# Patient Record
Sex: Male | Born: 1965 | Race: White | Hispanic: No | Marital: Married | Smoking: Former smoker
Health system: Southern US, Community
[De-identification: ages and names within clinical notes are randomized; demographics above are authoritative.]

## PROBLEM LIST (undated history)

## (undated) DIAGNOSIS — M1990 Unspecified osteoarthritis, unspecified site: Secondary | ICD-10-CM

## (undated) DIAGNOSIS — E785 Hyperlipidemia, unspecified: Secondary | ICD-10-CM

## (undated) DIAGNOSIS — I2510 Atherosclerotic heart disease of native coronary artery without angina pectoris: Secondary | ICD-10-CM

## (undated) DIAGNOSIS — I10 Essential (primary) hypertension: Secondary | ICD-10-CM

## (undated) DIAGNOSIS — G030 Nonpyogenic meningitis: Secondary | ICD-10-CM

## (undated) DIAGNOSIS — Z720 Tobacco use: Secondary | ICD-10-CM

## (undated) HISTORY — PX: KNEE SURGERY: SHX244

---

## 2003-07-20 ENCOUNTER — Encounter: Payer: Self-pay | Admitting: Emergency Medicine

## 2003-07-21 ENCOUNTER — Inpatient Hospital Stay (HOSPITAL_COMMUNITY): Admission: AD | Admit: 2003-07-21 | Discharge: 2003-07-22 | Payer: Self-pay | Admitting: Internal Medicine

## 2007-08-25 ENCOUNTER — Emergency Department (HOSPITAL_COMMUNITY): Admission: EM | Admit: 2007-08-25 | Discharge: 2007-08-25 | Payer: Self-pay | Admitting: Emergency Medicine

## 2008-10-09 ENCOUNTER — Emergency Department (HOSPITAL_COMMUNITY): Admission: EM | Admit: 2008-10-09 | Discharge: 2008-10-09 | Payer: Self-pay | Admitting: Emergency Medicine

## 2008-12-12 ENCOUNTER — Ambulatory Visit: Payer: Self-pay

## 2008-12-12 ENCOUNTER — Encounter: Payer: Self-pay | Admitting: Family Medicine

## 2008-12-13 ENCOUNTER — Ambulatory Visit (HOSPITAL_BASED_OUTPATIENT_CLINIC_OR_DEPARTMENT_OTHER): Admission: RE | Admit: 2008-12-13 | Discharge: 2008-12-13 | Payer: Self-pay | Admitting: Nurse Practitioner

## 2008-12-18 ENCOUNTER — Ambulatory Visit: Payer: Self-pay | Admitting: Internal Medicine

## 2011-02-05 NOTE — Procedures (Signed)
NAME:  Cameron [name], Cameron [name]                ACCOUNT NO.:  [id number]   MEDICAL RECORD NO.:  [id number]          PATIENT TYPE:  OUT   LOCATION:  SLEEP CENTER                 FACILITY:  [facility]   PHYSICIAN:  Clinton D. Maple Hudson, MD, FCCP, FACPDATE OF BIRTH:  02/29/64   DATE OF STUDY:  12/13/2008                            NOCTURNAL POLYSOMNOGRAM   REFERRING PHYSICIAN:   REFERRING PHYSICIAN:  Bennie Pierini MD   INDICATION FOR STUDY:  Hypersomnia with sleep apnea.   EPWORTH SLEEPINESS SCORE:  2/24, BMI 31.2.  Weight 250 pounds, height 75  inches.  Neck 16.5 inches.   MEDICATIONS:  None listed.   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   SLEEP ARCHITECTURE:  Total sleep time 350 minutes with sleep efficiency  94.1%.  Stage I was 5.6%, stage II 67.3%, stage III absent, REM 27.1% of  total sleep time.  Sleep latency 1.5 minutes, REM latency 83.5 minutes,  awake after sleep onset 18 minutes, arousal index 21.3.  Lights-out for  the study at 22:36 p.m.   RESPIRATORY DATA:  Apnea-hypopnea index (AHI) 13.2 per hour.  A total of  77 events was scored including 16 obstructive apneas, one central apnea,  and 60 hypopneas.  An additional 50 events were counted as respiratory  events related to arousal not meeting duration or other criteria to be  scored as full apneas or hypopneas, but still noted to have some  association with arousal for an average of 8.6 per hour and a total  respiratory disturbance index (RDI) of 21.8 per hour.  Most events were  associated with supine sleep position.  REM AHI 6.9 per hour.  This was  a diagnostic NPSG as ordered and CPAP titration was not done.   OXYGEN DATA:  Very loud snoring with oxygen desaturation to a nadir of  87%.  Mean oxygen saturation through the study 95.5% on room air.   CARDIAC DATA:  Normal sinus rhythm.   MOVEMENT-PARASOMNIA:  No significant movement disturbance.   Bathroom x2.   IMPRESSIONS-RECOMMENDATIONS:  1. This study was done as a standard nocturnal polysomnogram beginning      at 22:36 p.m.  Note that the patient works a rotating shift.  This      may be expected to impact sleep quality and daytime sleepiness.  2. Mild-to-moderate obstructive sleep apnea/hypopnea syndrome, AHI      13.2 per hour, RDI 21.8 per hour.  Most events were associated with      supine sleep position.  Very loud snoring with oxygen desaturation      to a nadir of 87%.  3. The study was done as ordered as a diagnostic NPSG protocol.      Consider return for CPAP titration or evaluate for alternative      management as clinically indicated.  There may be some benefit from      encouraging him to sleep off the flat of his back.      Clinton D. Maple Hudson, MD, FCCP, FACP  Diplomate, Biomedical engineer of Sleep Medicine  Electronically Signed  CDY/MEDQ  D:  12/18/2008 16:08:40  T:  12/19/2008 04:54:09  Job:  [id number]

## 2011-02-08 NOTE — Discharge Summary (Signed)
NAME:  Cameron [name], Cameron [name]                          ACCOUNT NO.:  [id number]   MEDICAL RECORD NO.:  [id number]                   PATIENT TYPE:  INP   LOCATION:  5702                                 FACILITY:  [facility]   PHYSICIAN:  Hollice Espy, M.D.            DATE OF BIRTH:  April 29, 1966   DATE OF ADMISSION:  07/21/2003  DATE OF DISCHARGE:  07/22/2003                                 DISCHARGE SUMMARY   PRIMARY CARE DOCTOR:  Magnus Sinning. Rice, M.D.   DISCHARGE DIAGNOSIS:  Aseptic meningitis.   DISCHARGE MEDICATIONS:  None other than Tylenol 500 mg p.o. q.8h. p.r.n.   DISCHARGE DIET:  Regular.   HISTORY OF PRESENT ILLNESS:  This is a 45 year old white male who works as a  Emergency planning/management officer, who has been in excellent health and four days ago began  just complaining of a sudden onset of severe headache.  The patient  previously has had no significant history for headaches and rarely ever been  ill.  These symptoms persisted and he also complained of some nausea and  vomiting and decided to come in.  A CT of his head was negative in the ER,  and he had a lumbar puncture done.  No bacteria was seen from the specimens  obtained and it was noted he had about 100 white cells on tubes #1 and #4  with noted polys and monos seen on these cell counts.  The patient was  started on Rocephin and initially he was in the [facility] ER, but  transferred over to [facility] for bed availability and isolation  precaution.  The patient was then started on IV acyclovir as well and not  seen in infectious diseases consult, but following an LP in the ER and  starting antibiotics, the patient said symptoms abated and by the time he  was transferred over to [facility], he had no complaints.  He was  tolerating p.o. and denied any nausea or vomiting, denied any headaches,  denied any visual changes.  He not once had had any neck stiffness or any  problems.  He had no neurological deficits.  The patient  was seen by  infectious diseases yesterday evening and it was felt that he had a simple  aseptic meningitis.  This did not require any contact isolation and does not  require any antibiotic treatment.  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  The patient was kept overnight for observation for no further symptoms.  He  denied any other complaints.  This morning, he feels fine.  He denies any  headache, visual changes, dizziness, nausea, or vomiting.  He has tolerated  p.o. without complaint, and is anxious and ready to leave.    DISPOSITION:  The patient will  be discharged to home.  His condition is  improved.  He is to follow up with his PCP, Dr. Dimple Casey, in two weeks for  checkup, and he is advised should his symptoms recur, of headache, nausea,  and vomiting, stiff neck, or visual changes, or photophobia, to come into  the ER right away.                                                Hollice Espy, M.D.    SKK/MEDQ  D:  07/22/2003  T:  07/22/2003  Job:  [id number]   cc:   Magnus Sinning. Dimple Casey, M.D.  [address] [place]  [place] [address]  Fax: [phone]

## 2011-02-26 ENCOUNTER — Ambulatory Visit (INDEPENDENT_AMBULATORY_CARE_PROVIDER_SITE_OTHER): Payer: Worker's Compensation | Admitting: Family Medicine

## 2011-02-26 ENCOUNTER — Encounter: Payer: Self-pay | Admitting: Family Medicine

## 2011-02-26 VITALS — BP 128/84 | Ht 74.0 in | Wt 250.0 lb

## 2011-02-26 DIAGNOSIS — M25569 Pain in unspecified knee: Secondary | ICD-10-CM

## 2011-02-26 DIAGNOSIS — M25562 Pain in left knee: Secondary | ICD-10-CM
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
## 2011-02-27 ENCOUNTER — Other Ambulatory Visit: Payer: Self-pay | Admitting: *Deleted

## 2011-02-27 DIAGNOSIS — M25562 Pain in left knee: Secondary | ICD-10-CM

## 2011-02-27 NOTE — Patient Instructions (Addendum)
[facility] [address] [place], [place] Patients appt is June 15th at 9:30 pt is aware of appt Faxed order  to [phone]      Dr.  Madelon Lips [facility] Tuesday 6.26.12 10:00am [address]  [place] Will fax notes to Roxan Diesel at [facility] @ [phone] Dx: L Meniscal Tear with bone contusion Will fax all records to [facility] at [facility] @ [phone]

## 2011-07-25 HISTORY — PX: JOINT REPLACEMENT: SHX530

## 2012-01-24 ENCOUNTER — Other Ambulatory Visit (INDEPENDENT_AMBULATORY_CARE_PROVIDER_SITE_OTHER): Payer: Self-pay

## 2012-01-24 ENCOUNTER — Encounter (INDEPENDENT_AMBULATORY_CARE_PROVIDER_SITE_OTHER): Payer: Self-pay

## 2012-01-27 ENCOUNTER — Ambulatory Visit (INDEPENDENT_AMBULATORY_CARE_PROVIDER_SITE_OTHER): Payer: 59 | Admitting: Surgery

## 2012-01-30 ENCOUNTER — Encounter (INDEPENDENT_AMBULATORY_CARE_PROVIDER_SITE_OTHER): Payer: Self-pay | Admitting: Surgery

## 2013-01-27 ENCOUNTER — Encounter: Payer: Self-pay | Admitting: Nurse Practitioner

## 2013-01-27 ENCOUNTER — Ambulatory Visit (INDEPENDENT_AMBULATORY_CARE_PROVIDER_SITE_OTHER): Payer: 59 | Admitting: Nurse Practitioner

## 2013-01-27 VITALS — BP 119/66 | HR 87 | Temp 97.3°F | Ht 73.5 in | Wt 260.0 lb

## 2013-01-27 DIAGNOSIS — L0231 Cutaneous abscess of buttock: Secondary | ICD-10-CM

## 2013-01-27 MED ORDER — SULFAMETHOXAZOLE-TRIMETHOPRIM 800-160 MG PO TABS: 1.0000 | ORAL_TABLET | Freq: Two times a day (BID) | ORAL | Status: DC

## 2013-01-27 NOTE — Patient Instructions (Signed)
Abscess An abscess is an infected area that contains a collection of pus and debris. It can occur in almost any part of the body. An abscess is also known as a furuncle or boil. CAUSES   An abscess occurs when tissue gets infected. This can occur from blockage of oil or sweat glands, infection of hair follicles, or a minor injury to the skin. As the body tries to fight the infection, pus collects in the area and creates pressure under the skin. This pressure causes pain. People with weakened immune systems have difficulty fighting infections and get certain abscesses more often.   SYMPTOMS Usually an abscess develops on the skin and becomes a painful mass that is red, warm, and tender. If the abscess forms under the skin, you may feel a moveable soft area under the skin. Some abscesses break open (rupture) on their own, but most will continue to get worse without care. The infection can spread deeper into the body and eventually into the bloodstream, causing you to feel ill.   DIAGNOSIS   Your caregiver will take your medical history and perform a physical exam. A sample of fluid may also be taken from the abscess to determine what is causing your infection. TREATMENT   Your caregiver may prescribe antibiotic medicines to fight the infection. However, taking antibiotics alone usually does not cure an abscess. Your caregiver may need to make a small cut (incision) in the abscess to drain the pus. In some cases, gauze is packed into the abscess to reduce pain and to continue draining the area. HOME CARE INSTRUCTIONS    Only take over-the-counter or prescription medicines for pain, discomfort, or fever as directed by your caregiver.   If you were prescribed antibiotics, take them as directed. Finish them even if you start to feel better.   If gauze is used, follow your caregiver's directions for changing the gauze.   To avoid spreading the infection:   Keep your draining abscess covered with a  bandage.   Wash your hands well.   Do not share personal care items, towels, or whirlpools with others.   Avoid skin contact with others.   Keep your skin and clothes clean around the abscess.   Keep all follow-up appointments as directed by your caregiver.  SEEK MEDICAL CARE IF:    You have increased pain, swelling, redness, fluid drainage, or bleeding.   You have muscle aches, chills, or a general ill feeling.   You have a fever.  MAKE SURE YOU:    Understand these instructions.   Will watch your condition.   Will get help right away if you are not doing well or get worse.  Document Released: 06/19/2005 Document Revised: 03/10/2012 Document Reviewed: 11/22/2011 Exit[employer] Patient Information 2013 [employer].    

## 2013-01-27 NOTE — Progress Notes (Signed)
  Subjective:    Patient ID: AKHIL PISCOPO, male    DOB: 01/28/1966, 47 y.o.   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 Lesion has returned. Stared draining several days ago. Tender to touch.    Review of Systems  All other systems reviewed and are negative.       Objective:   Physical Exam  Constitutional: He appears well-developed and well-nourished.  Cardiovascular: Normal rate and normal heart sounds.   Pulmonary/Chest: Effort normal and breath sounds normal.  Skin:  3cm indurated erythematous lesion left upper buttocks   BP 119/66  Pulse 87  Temp(Src) 97.3 F (36.3 C) (Oral)  Ht 6' 1.5" (1.867 m)  Wt 260 lb (117.935 kg)  BMI 33.83 kg/m2        Assessment & Plan:  1. Cellulitis and abscess of buttock Moist compresses Do not pick at area - sulfamethoxazole-trimethoprim (BACTRIM DS,SEPTRA DS) 800-160 MG per tablet; Take 1 tablet by mouth 2 (two) times daily.  Dispense: 20 tablet; Refill: 0 Mary-Margaret Daphine Deutscher, FNP

## 2013-02-01 ENCOUNTER — Ambulatory Visit (INDEPENDENT_AMBULATORY_CARE_PROVIDER_SITE_OTHER): Payer: 59 | Admitting: Physician Assistant

## 2013-02-01 ENCOUNTER — Encounter: Payer: Self-pay | Admitting: Physician Assistant

## 2013-02-01 VITALS — BP 117/70 | HR 86 | Temp 97.0°F | Ht 73.0 in | Wt 263.4 lb

## 2013-02-01 DIAGNOSIS — M549 Dorsalgia, unspecified: Secondary | ICD-10-CM

## 2013-02-01 LAB — POCT UA - MICROSCOPIC ONLY
Bacteria, U Microscopic: NEGATIVE
WBC, Ur, HPF, POC: NEGATIVE
Yeast, UA: NEGATIVE

## 2013-02-01 LAB — POCT URINALYSIS DIPSTICK
Bilirubin, UA: NEGATIVE
Ketones, UA: NEGATIVE
Leukocytes, UA: NEGATIVE

## 2013-02-01 MED ORDER — MELOXICAM 15 MG PO TABS: 15.0000 mg | ORAL_TABLET | Freq: Every day | ORAL | Status: DC

## 2013-02-01 MED ORDER — CYCLOBENZAPRINE HCL 10 MG PO TABS: 10.0000 mg | ORAL_TABLET | Freq: Three times a day (TID) | ORAL | Status: DC | PRN

## 2013-02-01 NOTE — Progress Notes (Signed)
Subjective:     Patient ID: Cameron [name], male   DOB: 19-Oct-1965, 47 y.o.   MRN: [id number]  Back Pain This is a new problem. The current episode started in the past 7 days. The problem occurs constantly. The problem is unchanged. The pain is present in the lumbar spine. The quality of the pain is described as aching. The pain does not radiate. The pain is at a severity of 4/10. The pain is mild. The pain is worse during the night. The symptoms are aggravated by bending. Stiffness is present all day. Pertinent negatives include no bladder incontinence, dysuria, fever, leg pain, numbness, paresis, paresthesias, perianal numbness, tingling or weakness. He has tried ice and NSAIDs for the symptoms. The treatment provided mild relief.     Review of Systems  Constitutional: Negative for fever.  Genitourinary: Negative for bladder incontinence and dysuria.  Musculoskeletal: Positive for back pain.  Neurological: Negative for tingling, weakness, numbness and paresthesias.       Objective:   Physical Exam  Musculoskeletal:       Thoracic back: He exhibits normal range of motion, no tenderness, no bony tenderness, no swelling, no edema, no deformity and no spasm.       Lumbar back: He exhibits normal range of motion, no tenderness, no bony tenderness, no swelling, no edema, no deformity and no spasm.  Neurological:  SLR neg DTR 2+/= lower ext       Assessment:     Back pain    Plan:     Mobic and Flexeril rx - SE reviewed Heat/Ice Gentle strectching

## 2013-02-09 ENCOUNTER — Encounter: Payer: Self-pay | Admitting: Nurse Practitioner

## 2013-02-09 ENCOUNTER — Ambulatory Visit (INDEPENDENT_AMBULATORY_CARE_PROVIDER_SITE_OTHER): Payer: 59 | Admitting: Nurse Practitioner

## 2013-02-09 VITALS — BP 124/74 | HR 79 | Temp 97.0°F | Ht 73.25 in | Wt 260.0 lb

## 2013-02-09 DIAGNOSIS — B029 Zoster without complications: Secondary | ICD-10-CM

## 2013-02-09 MED ORDER — TRAMADOL HCL 50 MG PO TABS: 50.0000 mg | ORAL_TABLET | Freq: Three times a day (TID) | ORAL | Status: DC | PRN

## 2013-02-09 MED ORDER — VALACYCLOVIR HCL 1 G PO TABS: 1000.0000 mg | ORAL_TABLET | Freq: Three times a day (TID) | ORAL | Status: DC

## 2013-02-09 NOTE — Progress Notes (Signed)
  Subjective:    Patient ID: Cameron [name], male    DOB: 1965-12-29, 47 y.o.   MRN: [id number]  HPI Patient was seen by B. Webster last week with back pain- Was given flexerile and anti inflammatory- No better- Actually has a rash in area and is experiencing a sensation of numbness. Rash itches slightly.    Review of Systems  Constitutional: Negative for fever, chills and appetite change.  HENT: Negative.   Respiratory: Negative.   Cardiovascular: Negative.   Gastrointestinal: Negative for nausea, vomiting and diarrhea.  Musculoskeletal: Positive for back pain (slight).  Skin: Positive for rash (right flank).  Psychiatric/Behavioral: Negative.        Objective:   Physical Exam  Constitutional: He appears well-developed and well-nourished.  Cardiovascular: Normal rate, normal heart sounds and intact distal pulses.   Pulmonary/Chest: Effort normal and breath sounds normal.  Abdominal: Soft. Bowel sounds are normal.  Skin: Skin is warm. Rash (4cm patch of vesicular lesions right fllank.) noted.          Assessment & Plan:

## 2013-02-09 NOTE — Patient Instructions (Signed)

## 2013-02-18 ENCOUNTER — Encounter: Payer: Self-pay | Admitting: Nurse Practitioner

## 2013-02-18 ENCOUNTER — Ambulatory Visit (INDEPENDENT_AMBULATORY_CARE_PROVIDER_SITE_OTHER): Payer: 59 | Admitting: Nurse Practitioner

## 2013-02-18 VITALS — BP 106/67 | HR 72 | Temp 97.3°F | Ht 73.25 in | Wt 258.5 lb

## 2013-02-18 DIAGNOSIS — Q828 Other specified congenital malformations of skin: Secondary | ICD-10-CM

## 2013-02-18 NOTE — Patient Instructions (Signed)
Wound Care Wound care helps prevent pain and infection.  You may need a tetanus shot if:  You cannot remember when you had your last tetanus shot.  You have never had a tetanus shot.  The injury broke your skin. If you need a tetanus shot and you choose not to have one, you may get tetanus. Sickness from tetanus can be serious. HOME CARE   Only take medicine as told by your doctor.  Clean the wound daily with mild soap and water.  Change any bandages (dressings) as told by your doctor.  Put medicated cream and a bandage on the wound as told by your doctor.  Change the bandage if it gets wet, dirty, or starts to smell.  Take showers. Do not take baths, swim, or do anything that puts your wound under water.  Rest and raise (elevate) the wound until the pain and puffiness (swelling) are better.  Keep all doctor visits as told. GET HELP RIGHT AWAY IF:   Yellowish-white fluid (pus) comes from the wound.  Medicine does not lessen your pain.  There is a red streak going away from the wound.  You have a fever. MAKE SURE YOU:   Understand these instructions.  Will watch your condition.  Will get help right away if you are not doing well or get worse. Document Released: 06/18/2008 Document Revised: 12/02/2011 Document Reviewed: 01/13/2011 ExitCare Patient Information 2014 [employer].  

## 2013-02-18 NOTE — Progress Notes (Signed)
  Subjective:    Patient ID: Cameron [name], male    DOB: Oct 31, 1965, 47 y.o.   MRN: [id number]  HPI Patient in today for removal of 2 skin tags right axilla- They have been there for several years and have grown in size.    Review of Systems  Unable to perform ROS All other systems reviewed and are negative.       Objective:   Physical Exam  Constitutional: He is oriented to person, place, and time. He appears well-developed and well-nourished.  Cardiovascular: Normal rate, normal heart sounds and intact distal pulses.   Pulmonary/Chest: Effort normal and breath sounds normal.  Neurological: He is alert and oriented to person, place, and time.  Skin: Skin is warm and dry.  2 flesh colored skin tags right axilla- 1-0.5cm and 2-0.5cm  Psychiatric: He has a normal mood and affect. His behavior is normal. Judgment and thought content normal.    Procedure: Cleaned with beatdine Lidocaine 1% with epi- 1ml local Cleaned with betadine again Tags removed with stevens scissors and pick ups Monsels solution for cauterization bandaid applied      Assessment & Plan:  Removal of skin tag right axilla  Wound care directions given to patient Watch for signs of infection RTO PRN  Mary-Margaret Daphine Deutscher, FNP

## 2013-07-29 ENCOUNTER — Other Ambulatory Visit: Payer: Self-pay

## 2013-09-10 ENCOUNTER — Ambulatory Visit (INDEPENDENT_AMBULATORY_CARE_PROVIDER_SITE_OTHER): Payer: 59 | Admitting: Physician Assistant

## 2013-09-10 ENCOUNTER — Encounter: Payer: Self-pay | Admitting: Physician Assistant

## 2013-09-10 DIAGNOSIS — H6690 Otitis media, unspecified, unspecified ear: Secondary | ICD-10-CM

## 2013-09-10 MED ORDER — AZITHROMYCIN 250 MG PO TABS: ORAL_TABLET | ORAL | Status: DC

## 2013-09-10 NOTE — Patient Instructions (Signed)
May need add Sudafed if pressure and dizziness continues.

## 2013-09-10 NOTE — Progress Notes (Signed)
   Subjective:    Patient ID: Cameron [name], male    DOB: Dec 25, 1965, 47 y.o.   MRN: [id number]  HPI 47 y/o male presents with R ear pain x 3 days. Worse with lying on his right side. Better with swallowing. Has not tried any medications.   Review of Systems  Constitutional: Negative.   HENT: Positive for ear pain (Right ear. Worse with lying on right side. ) and tinnitus (right ear). Negative for congestion, dental problem, facial swelling, hearing loss, postnasal drip and sinus pressure.   Eyes: Negative.   Respiratory: Negative.   Cardiovascular: Negative.   Gastrointestinal: Negative.   Genitourinary: Negative.   Neurological: Positive for dizziness (intermittent when going from supine position to sitting).       Objective:   Physical Exam  Nursing note and vitals reviewed. Constitutional: He appears well-developed and well-nourished. No distress.  HENT:  Head: Normocephalic and atraumatic.  Right Ear: External ear normal. Tympanic membrane is erythematous.  Left Ear: Tympanic membrane and external ear normal.  Mouth/Throat: Oropharynx is clear and moist. No oropharyngeal exudate.  Eyes: Pupils are equal, round, and reactive to light. Right eye exhibits no discharge. Left eye exhibits no discharge.  Neck: Normal range of motion.  Cardiovascular: Normal rate, regular rhythm, normal heart sounds and intact distal pulses.  Exam reveals no gallop and no friction rub.   No murmur heard. Pulmonary/Chest: Effort normal and breath sounds normal. No respiratory distress. He has no wheezes. He has no rales. He exhibits no tenderness.  Skin: He is not diaphoretic.          Assessment & Plan:  1. AOM Right ear: Rx azithromycin 500mg  day 1, 250 mg x 4 days. Add Sudafed if pressure continues for decongestant. Tylenol/ Ibuprofen for pain. RTC if s/s worsen or do not improve.

## 2013-11-01 ENCOUNTER — Ambulatory Visit (INDEPENDENT_AMBULATORY_CARE_PROVIDER_SITE_OTHER): Payer: 59 | Admitting: Nurse Practitioner

## 2013-11-01 ENCOUNTER — Encounter: Payer: Self-pay | Admitting: Nurse Practitioner

## 2013-11-01 VITALS — BP 135/76 | HR 79 | Temp 96.6°F | Ht 74.0 in | Wt 265.0 lb

## 2013-11-01 DIAGNOSIS — Z0000 Encounter for general adult medical examination without abnormal findings: Secondary | ICD-10-CM

## 2013-11-01 LAB — POCT CBC
GRANULOCYTE PERCENT: 72.3 % (ref 37–80)
HCT, POC: 49.8 % (ref 43.5–53.7)
Hemoglobin: 16.3 g/dL (ref 14.1–18.1)
LYMPH, POC: 2.4 (ref 0.6–3.4)
MCH, POC: 30.6 pg (ref 27–31.2)
MCHC: 32.7 g/dL (ref 31.8–35.4)
MCV: 93.6 fL (ref 80–97)
MPV: 8.2 fL (ref 0–99.8)
PLATELET COUNT, POC: 249 10*3/uL (ref 142–424)
POC GRANULOCYTE: 7.9 — AB (ref 2–6.9)
POC LYMPH %: 22.3 % (ref 10–50)
RBC: 5.3 M/uL (ref 4.69–6.13)
RDW, POC: 12.3 %
WBC: 10.9 10*3/uL — AB (ref 4.6–10.2)

## 2013-11-01 NOTE — Patient Instructions (Signed)

## 2013-11-01 NOTE — Progress Notes (Signed)
   Subjective:    Patient ID: MANISH RUGGIERO, male    DOB: April 12, 1966, 48 y.o.   MRN: [id number]  HPI Patient in today for CPE- He is doing well without complaints. He has no complaints today.    Review of Systems  Constitutional: Negative for fever and chills.  HENT: Positive for congestion and rhinorrhea. Negative for sinus pressure, sore throat and trouble swallowing.   Respiratory: Positive for cough.   Cardiovascular: Negative.   Gastrointestinal: Negative.   Genitourinary: Negative.   Musculoskeletal: Negative.   Hematological: Negative.   Psychiatric/Behavioral: Negative.   All other systems reviewed and are negative.       Objective:   Physical Exam  Constitutional: He is oriented to person, place, and time. He appears well-developed and well-nourished.  HENT:  Head: Normocephalic.  Right Ear: External ear normal.  Left Ear: External ear normal.  Nose: Nose normal.  Mouth/Throat: Oropharynx is clear and moist.  Eyes: EOM are normal. Pupils are equal, round, and reactive to light.  Neck: Normal range of motion. Neck supple. No JVD present. No thyromegaly present.  Cardiovascular: Normal rate, regular rhythm, normal heart sounds and intact distal pulses.  Exam reveals no gallop and no friction rub.   No murmur heard. Pulmonary/Chest: Effort normal and breath sounds normal. No respiratory distress. He has no wheezes. He has no rales. He exhibits no tenderness.  Abdominal: Soft. Bowel sounds are normal. He exhibits no mass. There is no tenderness.  Genitourinary: Prostate normal and penis normal.  Musculoskeletal: Normal range of motion. He exhibits no edema.  Lymphadenopathy:    He has no cervical adenopathy.  Neurological: He is alert and oriented to person, place, and time. No cranial nerve deficit.  Skin: Skin is warm and dry.  Psychiatric: He has a normal mood and affect. His behavior is normal. Judgment and thought content normal.   BP 135/76  Pulse 79   Temp(Src) 96.6 F (35.9 C) (Oral)  Ht $R'6\' 2"'XL$  (1.88 m)  Wt 265 lb (120.203 kg)  BMI 34.01 kg/m2        Assessment & Plan:   1. Annual physical exam    Orders Placed This Encounter  Procedures  . CMP14+EGFR  . NMR, lipoprofile  . POCT CBC   Labs pending Health maintenance reviewed Diet and exercise encouraged Continue all meds Follow up  In 6 months    [place], FNP

## 2013-11-03 LAB — NMR, LIPOPROFILE
Cholesterol: 170 mg/dL (ref <200)
HDL CHOLESTEROL BY NMR: 41 mg/dL (ref >=40)
HDL Particle Number: 28.8 umol/L — ABNORMAL LOW (ref >=30.5)
LDL Particle Number: 1701 nmol/L — ABNORMAL HIGH (ref <1000)
LDL Size: 20 nm — ABNORMAL LOW (ref >20.5)
LDLC SERPL CALC-MCNC: 105 mg/dL — AB (ref <100)
LP-IR Score: 74 — ABNORMAL HIGH (ref <=45)
Small LDL Particle Number: 1208 nmol/L — ABNORMAL HIGH (ref <=527)
Triglycerides by NMR: 121 mg/dL (ref <150)

## 2013-11-03 LAB — CMP14+EGFR
ALT: 38 IU/L (ref 0–44)
AST: 31 IU/L (ref 0–40)
Albumin/Globulin Ratio: 2.1 (ref 1.1–2.5)
Albumin: 4.5 g/dL (ref 3.5–5.5)
Alkaline Phosphatase: 110 IU/L (ref 39–117)
BUN/Creatinine Ratio: 11 (ref 9–20)
BUN: 12 mg/dL (ref 6–24)
CALCIUM: 9.5 mg/dL (ref 8.7–10.2)
CO2: 25 mmol/L (ref 18–29)
CREATININE: 1.14 mg/dL (ref 0.76–1.27)
Chloride: 100 mmol/L (ref 97–108)
GFR calc Af Amer: 87 mL/min/{1.73_m2} (ref >59)
GFR calc non Af Amer: 75 mL/min/{1.73_m2} (ref >59)
GLOBULIN, TOTAL: 2.1 g/dL (ref 1.5–4.5)
GLUCOSE: 94 mg/dL (ref 65–99)
Potassium: 4.6 mmol/L (ref 3.5–5.2)
Sodium: 140 mmol/L (ref 134–144)
Total Bilirubin: 0.4 mg/dL (ref 0.0–1.2)
Total Protein: 6.6 g/dL (ref 6.0–8.5)

## 2013-11-25 ENCOUNTER — Encounter: Payer: Self-pay | Admitting: Nurse Practitioner

## 2014-12-18 ENCOUNTER — Emergency Department (INDEPENDENT_AMBULATORY_CARE_PROVIDER_SITE_OTHER)
Admission: EM | Admit: 2014-12-18 | Discharge: 2014-12-18 | Disposition: A | Discharge status: Home / Self Care | Payer: Worker's Compensation | Source: Home / Self Care | Attending: Family Medicine | Admitting: Family Medicine

## 2014-12-18 ENCOUNTER — Emergency Department (INDEPENDENT_AMBULATORY_CARE_PROVIDER_SITE_OTHER): Payer: Worker's Compensation

## 2014-12-18 ENCOUNTER — Encounter (HOSPITAL_COMMUNITY): Payer: Self-pay | Admitting: Emergency Medicine

## 2014-12-18 DIAGNOSIS — R609 Edema, unspecified: Secondary | ICD-10-CM | POA: Diagnosis not present

## 2014-12-18 DIAGNOSIS — R52 Pain, unspecified: Secondary | ICD-10-CM | POA: Diagnosis not present

## 2014-12-18 DIAGNOSIS — T1490 Injury, unspecified: Secondary | ICD-10-CM

## 2014-12-18 DIAGNOSIS — M79644 Pain in right finger(s): Secondary | ICD-10-CM

## 2014-12-18 DIAGNOSIS — T1490XA Injury, unspecified, initial encounter: Secondary | ICD-10-CM

## 2014-12-18 MED ORDER — MELOXICAM 15 MG PO TABS: 15.0000 mg | ORAL_TABLET | Freq: Every day | ORAL | Status: DC

## 2014-12-18 NOTE — ED Provider Notes (Signed)
CSN: [id number]     Arrival date & time 12/18/14  1037 History   None    Chief Complaint  Patient presents with  . 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 He was tackling a person and they landed on his hand. He now has pain in the proximal portion of the first metacarpal, as well as swelling. He thought he just jammed it but the pain and swelling have increased. A few hours ago he had difficulty releasing the thumb latch on his gun holster and he was told to come in and be checked out. Denies any other injury, no numbness in the finger.  History reviewed. No pertinent past medical history. Past Surgical History  Procedure Laterality Date  . Joint replacement Left 11/12    knee   Family History  Problem Relation Age of Onset  . Thyroid disease Mother   . Cancer Mother     moles- removed  . Stroke Father    History  Substance Use Topics  . Smoking status: Former Smoker    Quit date: 01/27/2001  . Smokeless tobacco: Not on file  . Alcohol Use: No    Review of Systems  Musculoskeletal:       See history of present illness  All other systems reviewed and are negative.   Allergies  Review of patient's allergies indicates no known allergies.  Home Medications   Prior to Admission medications   Medication Sig Start Date End Date Taking? Authorizing Provider  cyclobenzaprine (FLEXERIL) 10 MG tablet Take 1 tablet (10 mg total) by mouth 3 (three) times daily as needed for muscle spasms. 02/01/13   Lodema Pilot, PA-C  meloxicam (MOBIC) 15 MG tablet Take 1 tablet (15 mg total) by mouth daily. 12/18/14   Freeman Caldron Deondrick Searls, PA-C   BP 135/79 mmHg  Pulse 86  Temp(Src) 97.4 F (36.3 C) (Oral)  Resp 14  SpO2 96% Physical Exam  Constitutional: He is oriented to person, place, and time. He  appears well-developed and well-nourished. No distress.  HENT:  Head: Normocephalic.  Pulmonary/Chest: Effort normal. No respiratory distress.  Musculoskeletal:       Right hand: He exhibits tenderness (erythema, tenderness, swelling around the lateral proximal first metacarpal, just distal to the anatomic snuffbox) and swelling.  Neurological: He is alert and oriented to person, place, and time. Coordination normal.  Skin: Skin is warm and dry. No rash noted. He is not diaphoretic.  Psychiatric: He has a normal mood and affect. Judgment normal.  Nursing note and vitals reviewed.   ED Course  Procedures (including critical care time) Labs Review Labs Reviewed - No data to display  Imaging Review Dg Wrist Complete Right  12/18/2014   CLINICAL DATA:  Right wrist pain and right thumb pain post injury yesterday  EXAM: RIGHT WRIST - COMPLETE 3+ VIEW  COMPARISON:  None.  FINDINGS: Four views of the right wrist submitted. No acute fracture or subluxation. Mild degenerative changes first carpometacarpal joint. Mild narrowing of radiocarpal joint space.  IMPRESSION: No acute fracture or subluxation.  Mild degenerative changes.   Electronically Signed   By: Lahoma Crocker M.D.   On: 12/18/2014 11:34   Dg Hand Complete Right  12/18/2014   CLINICAL DATA:  Right thumb pain post injury yesterday  EXAM: RIGHT HAND - COMPLETE 3+ VIEW  COMPARISON:  None.  FINDINGS: Three views of the right hand submitted. No acute fracture or subluxation. Mild degenerative changes first carpometacarpal joint.  IMPRESSION: No acute fracture or subluxation. Mild degenerative changes first carpometacarpal joint.   Electronically Signed   By: Lahoma Crocker M.D.   On: 12/18/2014 11:33     MDM   1. Thumb pain, right   2. Injury   3. Pain   4. Swelling    No x-ray evidence of any fracture. Treat with a thumb spica splint, daily meloxicam, ice, elevation. Wear the splint for 5 days. He has to be cleared by their medical provider at  his work before returning, however he is advised to come back here or have them order an x-ray if he is still having significant pain in one week to check for occult fracture.  Meds ordered this encounter  Medications  . meloxicam (MOBIC) 15 MG tablet    Sig: Take 1 tablet (15 mg total) by mouth daily.    Dispense:  14 tablet    Refill:  0       Liam Graham, PA-C 12/18/14 1146

## 2014-12-18 NOTE — ED Notes (Signed)
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  States the person land on is right hand injury thumb sided.  Mild swelling noted and pain radiating down thumb side into wrist.

## 2014-12-18 NOTE — Discharge Instructions (Signed)

## 2015-02-09 ENCOUNTER — Telehealth: Payer: Self-pay | Admitting: Nurse Practitioner

## 2015-02-09 NOTE — Telephone Encounter (Signed)
Stp and given appt for 6/3 at 12:30 with MMM.

## 2015-02-10 ENCOUNTER — Ambulatory Visit: Payer: Self-pay | Admitting: Nurse Practitioner

## 2015-02-24 ENCOUNTER — Encounter: Payer: Self-pay | Admitting: Nurse Practitioner

## 2015-02-24 ENCOUNTER — Ambulatory Visit (INDEPENDENT_AMBULATORY_CARE_PROVIDER_SITE_OTHER): Payer: 59 | Admitting: Nurse Practitioner

## 2015-02-24 VITALS — BP 122/76 | HR 87 | Temp 97.3°F | Ht 74.0 in | Wt 267.0 lb

## 2015-02-24 DIAGNOSIS — Z0000 Encounter for general adult medical examination without abnormal findings: Secondary | ICD-10-CM | POA: Diagnosis not present

## 2015-02-24 LAB — POCT CBC
GRANULOCYTE PERCENT: 65.7 % (ref 37–80)
HEMATOCRIT: 48.4 % (ref 43.5–53.7)
Hemoglobin: 15.7 g/dL (ref 14.1–18.1)
Lymph, poc: 2.7 (ref 0.6–3.4)
MCH, POC: 29.9 pg (ref 27–31.2)
MCHC: 32.4 g/dL (ref 31.8–35.4)
MCV: 92.1 fL (ref 80–97)
MPV: 9 fL (ref 0–99.8)
POC Granulocyte: 5.9 (ref 2–6.9)
POC LYMPH PERCENT: 29.8 %L (ref 10–50)
Platelet Count, POC: 234 10*3/uL (ref 142–424)
RBC: 5.26 M/uL (ref 4.69–6.13)
RDW, POC: 12.3 %
WBC: 9 10*3/uL (ref 4.6–10.2)

## 2015-02-24 NOTE — Addendum Note (Signed)
Addended by: Earlene Plater on: 02/24/2015 01:10 PM   Modules accepted: Miquel Dunn

## 2015-02-24 NOTE — Progress Notes (Signed)
   Subjective:    Patient ID: Cameron [name], male    DOB: [birth year], 49 y.o.   MRN: [id number]  HPI  Patient in today for CPE- He is doing well without complaints. He has no complaints today.    Review of Systems  Constitutional: Negative for fever and chills.  HENT: Positive for congestion and rhinorrhea. Negative for sinus pressure, sore throat and trouble swallowing.   Respiratory: Positive for cough.   Cardiovascular: Negative.   Gastrointestinal: Negative.   Genitourinary: Negative.   Musculoskeletal: Negative.   Hematological: Negative.   Psychiatric/Behavioral: Negative.   All other systems reviewed and are negative.      Objective:   Physical Exam  Constitutional: He is oriented to person, place, and time. He appears well-developed and well-nourished.  HENT:  Head: Normocephalic.  Right Ear: External ear normal.  Left Ear: External ear normal.  Nose: Nose normal.  Mouth/Throat: Oropharynx is clear and moist.  Eyes: EOM are normal. Pupils are equal, round, and reactive to light.  Neck: Normal range of motion. Neck supple. No JVD present. No thyromegaly present.  Cardiovascular: Normal rate, regular rhythm, normal heart sounds and intact distal pulses.  Exam reveals no gallop and no friction rub.   No murmur heard. Pulmonary/Chest: Effort normal and breath sounds normal. No respiratory distress. He has no wheezes. He has no rales. He exhibits no tenderness.  Abdominal: Soft. Bowel sounds are normal. He exhibits no mass. There is no tenderness.  Genitourinary: Prostate normal and penis normal.  Musculoskeletal: Normal range of motion. He exhibits no edema.  Lymphadenopathy:    He has no cervical adenopathy.  Neurological: He is alert and oriented to person, place, and time. No cranial nerve deficit.  Skin: Skin is warm and dry.  Psychiatric: He has a normal mood and affect. His behavior is normal. Judgment and thought content normal.   BP 122/76 mmHg  Pulse 87   Temp(Src) 97.3 F (36.3 C) (Oral)  Ht _0  (1.88 m)  Wt 267 lb (121.11 kg)  BMI 34.27 kg/m2        Assessment & Plan:   1. Annual physical exam - POCT CBC - CMP14+EGFR - Lipid panel - PSA Total+%Free (Serial)    Labs pending Health maintenance reviewed Diet and exercise encouraged Continue all meds Follow up  In 1 year   [place], FNP

## 2015-02-24 NOTE — Patient Instructions (Signed)
Exercise to Stay Healthy Exercise helps you become and stay healthy. EXERCISE IDEAS AND TIPS Choose exercises that:  You enjoy.  Fit into your day. You do not need to exercise really hard to be healthy. You can do exercises at a slow or medium level and stay healthy. You can:  Stretch before and after working out.  Try yoga, Pilates, or tai chi.  Lift weights.  Walk fast, swim, jog, run, climb stairs, bicycle, dance, or rollerskate.  Take aerobic classes. Exercises that burn about 150 calories:  Running 1  miles in 15 minutes.  Playing volleyball for 45 to 60 minutes.  Washing and waxing a car for 45 to 60 minutes.  Playing touch football for 45 minutes.  Walking 1  miles in 35 minutes.  Pushing a stroller 1  miles in 30 minutes.  Playing basketball for 30 minutes.  Raking leaves for 30 minutes.  Bicycling 5 miles in 30 minutes.  Walking 2 miles in 30 minutes.  Dancing for 30 minutes.  Shoveling snow for 15 minutes.  Swimming laps for 20 minutes.  Walking up stairs for 15 minutes.  Bicycling 4 miles in 15 minutes.  Gardening for 30 to 45 minutes.  Jumping rope for 15 minutes.  Washing windows or floors for 45 to 60 minutes. Document Released: 10/12/2010 Document Revised: 12/02/2011 Document Reviewed: 10/12/2010 Exit[employer] Patient Information 2015 [employer]. This information is not intended to replace advice given to you by your health care provider. Make sure you discuss any questions you have with your health care provider.  

## 2015-02-25 LAB — CMP14+EGFR
A/G RATIO: 1.7 (ref 1.1–2.5)
ALT: 41 IU/L (ref 0–44)
AST: 31 IU/L (ref 0–40)
Albumin: 4.3 g/dL (ref 3.5–5.5)
Alkaline Phosphatase: 77 IU/L (ref 39–117)
BUN/Creatinine Ratio: 11 (ref 9–20)
BUN: 12 mg/dL (ref 6–24)
Bilirubin Total: 0.6 mg/dL (ref 0.0–1.2)
CO2: 21 mmol/L (ref 18–29)
Calcium: 9.4 mg/dL (ref 8.7–10.2)
Chloride: 103 mmol/L (ref 97–108)
Creatinine, Ser: 1.05 mg/dL (ref 0.76–1.27)
GFR calc Af Amer: 95 mL/min/{1.73_m2} (ref >59)
GFR calc non Af Amer: 82 mL/min/{1.73_m2} (ref >59)
Globulin, Total: 2.6 g/dL (ref 1.5–4.5)
Glucose: 98 mg/dL (ref 65–99)
Potassium: 4.3 mmol/L (ref 3.5–5.2)
Sodium: 140 mmol/L (ref 134–144)
Total Protein: 6.9 g/dL (ref 6.0–8.5)

## 2015-02-25 LAB — LIPID PANEL
Chol/HDL Ratio: 5.5 ratio units — ABNORMAL HIGH (ref 0.0–5.0)
Cholesterol, Total: 188 mg/dL (ref 100–199)
HDL: 34 mg/dL — AB (ref >39)
Triglycerides: 402 mg/dL — ABNORMAL HIGH (ref 0–149)

## 2015-02-25 LAB — PSA TOTAL+% FREE (SERIAL)
PSA FREE: 0.2 ng/mL
PSA, Free Pct: 20 %
Prostate Specific Ag, Serum: 1 ng/mL (ref 0.0–4.0)

## 2015-02-27 ENCOUNTER — Other Ambulatory Visit: Payer: Self-pay | Admitting: Family

## 2015-02-27 MED ORDER — ATORVASTATIN CALCIUM 40 MG PO TABS: 40.0000 mg | ORAL_TABLET | Freq: Every day | ORAL | Status: DC

## 2015-02-28 NOTE — Progress Notes (Signed)
lmtcb

## 2015-03-02 ENCOUNTER — Telehealth: Payer: Self-pay | Admitting: *Deleted

## 2015-03-02 NOTE — Telephone Encounter (Signed)
Pt notified of results Verbalizes understanding 

## 2015-03-02 NOTE — Telephone Encounter (Signed)
-----   Message from Sharion Balloon, [place] sent at 02/27/2015 12:36 PM EDT ----- Kidney and liver function stable Cholesterol levels elevated- Triglycerides extremely elevated- Lipitor 40 mg rx sent to pharmacy- Pt needs to be on low fat diet and follow up  PSA levels WNL

## 2015-04-20 ENCOUNTER — Other Ambulatory Visit: Payer: Self-pay | Admitting: Nurse Practitioner

## 2015-04-20 DIAGNOSIS — M545 Low back pain, unspecified: Secondary | ICD-10-CM

## 2015-04-20 MED ORDER — CYCLOBENZAPRINE HCL 10 MG PO TABS: 10.0000 mg | ORAL_TABLET | Freq: Three times a day (TID) | ORAL | Status: DC | PRN

## 2015-05-22 ENCOUNTER — Other Ambulatory Visit: Payer: Self-pay | Admitting: Nurse Practitioner

## 2015-07-28 ENCOUNTER — Emergency Department (HOSPITAL_COMMUNITY): Payer: 59

## 2015-07-28 ENCOUNTER — Emergency Department (HOSPITAL_COMMUNITY)
Admission: EM | Admit: 2015-07-28 | Discharge: 2015-07-28 | Disposition: A | Discharge status: Home / Self Care | Payer: 59 | Attending: Emergency Medicine | Admitting: Emergency Medicine

## 2015-07-28 ENCOUNTER — Encounter (HOSPITAL_COMMUNITY): Payer: Self-pay

## 2015-07-28 DIAGNOSIS — N23 Unspecified renal colic: Secondary | ICD-10-CM | POA: Diagnosis not present

## 2015-07-28 DIAGNOSIS — R1031 Right lower quadrant pain: Secondary | ICD-10-CM | POA: Diagnosis present

## 2015-07-28 LAB — COMPREHENSIVE METABOLIC PANEL
ALBUMIN: 4.3 g/dL (ref 3.5–5.0)
ALT: 39 U/L (ref 17–63)
AST: 33 U/L (ref 15–41)
Alkaline Phosphatase: 71 U/L (ref 38–126)
Anion gap: 12 (ref 5–15)
BUN: 19 mg/dL (ref 6–20)
CHLORIDE: 104 mmol/L (ref 101–111)
CO2: 23 mmol/L (ref 22–32)
Calcium: 9.4 mg/dL (ref 8.9–10.3)
Creatinine, Ser: 1.54 mg/dL — ABNORMAL HIGH (ref 0.61–1.24)
GFR calc Af Amer: 59 mL/min — ABNORMAL LOW (ref >60)
GFR calc non Af Amer: 51 mL/min — ABNORMAL LOW (ref >60)
Glucose, Bld: 124 mg/dL — ABNORMAL HIGH (ref 65–99)
POTASSIUM: 3.8 mmol/L (ref 3.5–5.1)
Sodium: 139 mmol/L (ref 135–145)
Total Bilirubin: 0.8 mg/dL (ref 0.3–1.2)
Total Protein: 6.8 g/dL (ref 6.5–8.1)

## 2015-07-28 LAB — URINE MICROSCOPIC-ADD ON

## 2015-07-28 LAB — LIPASE, BLOOD: Lipase: 34 U/L (ref 11–51)

## 2015-07-28 LAB — CBC
HEMATOCRIT: 44.1 % (ref 39.0–52.0)
Hemoglobin: 15.9 g/dL (ref 13.0–17.0)
MCH: 32.1 pg (ref 26.0–34.0)
MCHC: 36.1 g/dL — AB (ref 30.0–36.0)
MCV: 88.9 fL (ref 78.0–100.0)
Platelets: 271 10*3/uL (ref 150–400)
RBC: 4.96 MIL/uL (ref 4.22–5.81)
RDW: 12.4 % (ref 11.5–15.5)
WBC: 11.6 10*3/uL — ABNORMAL HIGH (ref 4.0–10.5)

## 2015-07-28 LAB — URINALYSIS, ROUTINE W REFLEX MICROSCOPIC
BILIRUBIN URINE: NEGATIVE
GLUCOSE, UA: NEGATIVE mg/dL
Ketones, ur: NEGATIVE mg/dL
Leukocytes, UA: NEGATIVE
Nitrite: NEGATIVE
PH: 5 (ref 5.0–8.0)
Protein, ur: NEGATIVE mg/dL
SPECIFIC GRAVITY, URINE: 1.023 (ref 1.005–1.030)
Urobilinogen, UA: 0.2 mg/dL (ref 0.0–1.0)

## 2015-07-28 MED ORDER — OXYCODONE-ACETAMINOPHEN 5-325 MG PO TABS: 1.0000 | ORAL_TABLET | ORAL | Status: DC | PRN

## 2015-07-28 MED ORDER — ONDANSETRON HCL 4 MG/2ML IJ SOLN
4.0000 mg | Freq: Once | INTRAMUSCULAR | Status: AC
  Administered 2015-07-28: 4 mg via INTRAVENOUS
  Filled 2015-07-28: qty 2

## 2015-07-28 MED ORDER — HYDROMORPHONE HCL 1 MG/ML IJ SOLN
1.0000 mg | Freq: Once | INTRAMUSCULAR | Status: AC
  Administered 2015-07-28: 1 mg via INTRAVENOUS
  Filled 2015-07-28: qty 1

## 2015-07-28 NOTE — ED Notes (Signed)
Pt left with all his belongings and ambulated out of the treatment area with his family.

## 2015-07-28 NOTE — ED Notes (Signed)
Pt here for RLQ abdominal pain that radiates to groin/testicle area onset 1 hour prior to arrival, associated with nausea and vomiting. Pt appears very uncomfortable at triage, moaning and unable to sit still.

## 2015-07-28 NOTE — ED Provider Notes (Signed)
CSN: [id number]     Arrival date & time 07/28/15  0242 History  By signing my name below, I, Erling Conte, attest that this documentation has been prepared under the direction and in the presence of Orpah Greek, MD. Electronically Signed: Erling Conte, ED Scribe. 07/28/2015. 4:23 AM.    Chief Complaint  Patient presents with  . Abdominal Pain    The history is provided by the patient. No language interpreter was used.    HPI Comments: Cameron [name] is a 49 y.o. male with no PMHx who presents to the Emergency Department complaining of sudden onset, constant, severe, RLQ abdominal pain that radiates to his groin and testicular area that began 1 hour PTA. He states the pain began as lower back pain that gradually started to radiate to his RLQ. He reports associated nausea, vomiting and difficulty urinating urinating. He states the pain is worse with movement. He has not had any medications PTA. Pt denies any prior history of kidney stones. He notes he still has his appendix and denies any abdominal surgeries. He denies any fevers, chills or other complaints at this time.  History reviewed. No pertinent past medical history. Past Surgical History  Procedure Laterality Date  . Joint replacement Left 11/12    knee   Family History  Problem Relation Age of Onset  . Thyroid disease Mother   . Cancer Mother     moles- removed  . Stroke Father    Social History  Substance Use Topics  . Smoking status: Former Smoker    Quit date: 01/27/2001  . Smokeless tobacco: None  . Alcohol Use: No    Review of Systems  Constitutional: Negative for fever and chills.  Gastrointestinal: Positive for nausea, vomiting and abdominal pain.  Genitourinary: Positive for testicular pain.  Musculoskeletal: Positive for back pain.  All other systems reviewed and are negative.     Allergies  Review of patient's allergies indicates no known allergies.  Home Medications   Prior to  Admission medications   Medication Sig Start Date End Date Taking? Authorizing Provider  atorvastatin (LIPITOR) 40 MG tablet Take 1 tablet (40 mg total) by mouth daily. 02/27/15   Sharion Balloon, FNP  cyclobenzaprine (FLEXERIL) 10 MG tablet Take 1 tablet (10 mg total) by mouth 3 (three) times daily as needed for muscle spasms. 04/20/15   Mary-Margaret Hassell Done, FNP   Triage Vitals: BP 175/92 mmHg  Pulse 95  Temp(Src) 97.6 F (36.4 C) (Oral)  Resp 18  Ht 6\' 3"  (1.905 m)  Wt 255 lb (115.667 kg)  BMI 31.87 kg/m2  SpO2 96%  Physical Exam  Constitutional: He is oriented to person, place, and time. He appears well-developed and well-nourished.  Pt looks uncomfortable  HENT:  Head: Normocephalic and atraumatic.  Right Ear: Hearing normal.  Left Ear: Hearing normal.  Nose: Nose normal.  Mouth/Throat: Oropharynx is clear and moist and mucous membranes are normal.  Eyes: Conjunctivae and EOM are normal. Pupils are equal, round, and reactive to light.  Neck: Normal range of motion. Neck supple.  Cardiovascular: Regular rhythm, S1 normal and S2 normal.  Exam reveals no gallop and no friction rub.   No murmur heard. Pulmonary/Chest: Effort normal and breath sounds normal. No respiratory distress. He exhibits no tenderness.  Abdominal: Soft. Normal appearance and bowel sounds are normal. There is no hepatosplenomegaly. There is no tenderness. There is no rebound, no guarding, no tenderness at McBurney's point and negative Murphy's sign. No hernia.  Musculoskeletal:  Normal range of motion.  Neurological: He is alert and oriented to person, place, and time. He has normal strength. No cranial nerve deficit or sensory deficit. Coordination normal. GCS eye subscore is 4. GCS verbal subscore is 5. GCS motor subscore is 6.  Skin: Skin is warm, dry and intact. No rash noted. No cyanosis.  Psychiatric: He has a normal mood and affect. His speech is normal and behavior is normal. Thought content normal.   Nursing note and vitals reviewed.   ED Course  Procedures (including critical care time)  DIAGNOSTIC STUDIES: Oxygen Saturation is 96% on RA, normal by my interpretation.    COORDINATION OF CARE: 3:17 AM- Will order CT renal stone study, blood lipase, CMP, CBC and UA. Will also order Dilaudid 1mg  injection, 4mg  Zofran injection. Pt advised of plan for treatment and pt agrees.   Labs Review Labs Reviewed  COMPREHENSIVE METABOLIC PANEL - Abnormal; Notable for the following:    Glucose, Bld 124 (*)    Creatinine, Ser 1.54 (*)    GFR calc non Af Amer 51 (*)    GFR calc Af Amer 59 (*)    All other components within normal limits  CBC - Abnormal; Notable for the following:    WBC 11.6 (*)    MCHC 36.1 (*)    All other components within normal limits  URINALYSIS, ROUTINE W REFLEX MICROSCOPIC (NOT AT [facility]) - Abnormal; Notable for the following:    APPearance TURBID (*)    Hgb urine dipstick LARGE (*)    All other components within normal limits  URINE MICROSCOPIC-ADD ON - Abnormal; Notable for the following:    Bacteria, UA MANY (*)    Casts HYALINE CASTS (*)    All other components within normal limits  LIPASE, BLOOD    Imaging Review Ct Renal Stone Study  07/28/2015  CLINICAL DATA:  Sudden onset right lower abdominal pain radiating to the back. EXAM: CT ABDOMEN AND PELVIS WITHOUT CONTRAST TECHNIQUE: Multidetector CT imaging of the abdomen and pelvis was performed following the standard protocol without IV contrast. COMPARISON:  None. FINDINGS: There is moderate hydronephrosis and ureterectasis on the right with perinephric and periureteral stranding. There is a 2 mm calculus within the urinary bladder lumen. This likely represents recent passage of right ureteral calculus. No calculi are currently present in the ureters or collecting systems. There are unremarkable unenhanced appearances of the liver, gallbladder, pancreas, spleen and adrenals. The abdominal aorta is normal in  caliber. There is mild atherosclerotic calcification. There is no adenopathy in the abdomen or pelvis. There are normal appearances of the stomach, small bowel and colon. The appendix is normal. There is no significant abnormality in the lower chest. There is no significant musculoskeletal lesion. Lower lumbar degenerative disc changes are present. A tiny fat containing umbilical hernia is incidentally noted. IMPRESSION: Probable recent passage of a 2 mm calculus from the right ureter into the urinary bladder. Moderate residual hydronephrosis and ureterectasis. No ureteral calculus at this time. Electronically Signed   By: Andreas Newport M.D.   On: 07/28/2015 03:55   I have personally reviewed and evaluated these images and lab results as part of my medical decision-making.   EKG Interpretation None      MDM   Final diagnoses:  None   renal colic  Presents for sudden onset of right flank pain that has progressed to pain down into the right testicle area. Patient has never had similar symptoms. He did present with complaints consistent  with renal colic and this was confirmed with CT. It appears that the patient has passed the stone already. He will be discharged, follow-up with urology, analgesia if needed.  I personally performed the services described in this documentation, which was scribed in my presence. The recorded information has been reviewed and is accurate.    Orpah Greek, MD 07/28/15 [phone]

## 2015-07-28 NOTE — Discharge Instructions (Signed)
Kidney Stones °Kidney stones (urolithiasis) are deposits that form inside your kidneys. The intense pain is caused by the stone moving through the urinary tract. When the stone moves, the ureter goes into spasm around the stone. The stone is usually passed in the urine.  °CAUSES  °· A disorder that makes certain neck glands produce too much parathyroid hormone (primary hyperparathyroidism). °· A buildup of uric acid crystals, similar to gout in your joints. °· Narrowing (stricture) of the ureter. °· A kidney obstruction present at birth (congenital obstruction). °· Previous surgery on the kidney or ureters. °· Numerous kidney infections. °SYMPTOMS  °· Feeling sick to your stomach (nauseous). °· Throwing up (vomiting). °· Blood in the urine (hematuria). °· Pain that usually spreads (radiates) to the groin. °· Frequency or urgency of urination. °DIAGNOSIS  °· Taking a history and physical exam. °· Blood or urine tests. °· CT scan. °· Occasionally, an examination of the inside of the urinary bladder (cystoscopy) is performed. °TREATMENT  °· Observation. °· Increasing your fluid intake. °· Extracorporeal shock wave lithotripsy--This is a noninvasive procedure that uses shock waves to break up kidney stones. °· Surgery may be needed if you have severe pain or persistent obstruction. There are various surgical procedures. Most of the procedures are performed with the use of small instruments. Only small incisions are needed to accommodate these instruments, so recovery time is minimized. °The size, location, and chemical composition are all important variables that will determine the proper choice of action for you. Talk to your health care provider to better understand your situation so that you will minimize the risk of injury to yourself and your kidney.  °HOME CARE INSTRUCTIONS  °· Drink enough water and fluids to keep your urine clear or pale yellow. This will help you to pass the stone or stone fragments. °· Strain  all urine through the provided strainer. Keep all particulate matter and stones for your health care provider to see. The stone causing the pain may be as small as a grain of salt. It is very important to use the strainer each and every time you pass your urine. The collection of your stone will allow your health care provider to analyze it and verify that a stone has actually passed. The stone analysis will often identify what you can do to reduce the incidence of recurrences. °· Only take over-the-counter or prescription medicines for pain, discomfort, or fever as directed by your health care provider. °· Keep all follow-up visits as told by your health care provider. This is important. °· Get follow-up X-rays if required. The absence of pain does not always mean that the stone has passed. It may have only stopped moving. If the urine remains completely obstructed, it can cause loss of kidney function or even complete destruction of the kidney. It is your responsibility to make sure X-rays and follow-ups are completed. Ultrasounds of the kidney can show blockages and the status of the kidney. Ultrasounds are not associated with any radiation and can be performed easily in a matter of minutes. °· Make changes to your daily diet as told by your health care provider. You may be told to: °¨ Limit the amount of salt that you eat. °¨ Eat 5 or more servings of fruits and vegetables each day. °¨ Limit the amount of meat, poultry, fish, and eggs that you eat. °· Collect a 24-hour urine sample as told by your health care provider. You may need to collect another urine sample every 6-12   months. °SEEK MEDICAL CARE IF: °· You experience pain that is progressive and unresponsive to any pain medicine you have been prescribed. °SEEK IMMEDIATE MEDICAL CARE IF:  °· Pain cannot be controlled with the prescribed medicine. °· You have a fever or shaking chills. °· The severity or intensity of pain increases over 18 hours and is not  relieved by pain medicine. °· You develop a new onset of abdominal pain. °· You feel faint or pass out. °· You are unable to urinate. °  °This information is not intended to replace advice given to you by your health care provider. Make sure you discuss any questions you have with your health care provider. °  °Document Released: 09/09/2005 Document Revised: 05/31/2015 Document Reviewed: 02/10/2013 °Elsevier Interactive Patient Education ©2016 [employer] ° °

## 2015-10-09 ENCOUNTER — Telehealth: Payer: Self-pay | Admitting: Nurse Practitioner

## 2015-10-09 ENCOUNTER — Encounter: Payer: Self-pay | Admitting: Nurse Practitioner

## 2015-10-23 NOTE — Telephone Encounter (Signed)
Several attempts have been made to contact patient this encounter will be closed.  

## 2016-04-02 ENCOUNTER — Other Ambulatory Visit: Payer: Self-pay | Admitting: Orthopedic Surgery

## 2016-04-02 DIAGNOSIS — M1990 Unspecified osteoarthritis, unspecified site: Secondary | ICD-10-CM

## 2016-04-26 ENCOUNTER — Ambulatory Visit
Admission: RE | Admit: 2016-04-26 | Discharge: 2016-04-26 | Disposition: A | Discharge status: Home / Self Care | Payer: 59 | Source: Ambulatory Visit | Attending: Orthopedic Surgery | Admitting: Orthopedic Surgery

## 2016-04-26 DIAGNOSIS — M1990 Unspecified osteoarthritis, unspecified site: Secondary | ICD-10-CM

## 2016-04-26 MED ORDER — IOPAMIDOL (ISOVUE-M 200) INJECTION 41%
1.0000 mL | Freq: Once | INTRAMUSCULAR | Status: DC
Start: 2016-04-26 — End: 2016-04-27

## 2016-04-26 MED ORDER — TRIAMCINOLONE ACETONIDE 40 MG/ML IJ SUSP (RADIOLOGY): 60.0000 mg | Freq: Once | INTRAMUSCULAR | Status: DC

## 2016-05-08 ENCOUNTER — Ambulatory Visit (HOSPITAL_COMMUNITY)
Admission: EM | Admit: 2016-05-08 | Discharge: 2016-05-08 | Disposition: A | Discharge status: Nursing Home (Includes ICF) | Payer: 59 | Attending: Family Medicine | Admitting: Family Medicine

## 2016-05-08 ENCOUNTER — Encounter (HOSPITAL_COMMUNITY): Payer: Self-pay | Admitting: *Deleted

## 2016-05-08 ENCOUNTER — Encounter (HOSPITAL_COMMUNITY)
Admission: EM | Disposition: A | Discharge status: Home / Self Care | Payer: Self-pay | Source: Home / Self Care | Attending: Cardiovascular Disease

## 2016-05-08 ENCOUNTER — Inpatient Hospital Stay (HOSPITAL_COMMUNITY)
Admission: EM | Admit: 2016-05-08 | Discharge: 2016-05-09 | DRG: 247 | Disposition: A | Discharge status: Home / Self Care | Payer: 59 | Attending: Cardiovascular Disease | Admitting: Cardiovascular Disease

## 2016-05-08 ENCOUNTER — Emergency Department (HOSPITAL_COMMUNITY): Payer: 59

## 2016-05-08 DIAGNOSIS — R079 Chest pain, unspecified: Secondary | ICD-10-CM | POA: Diagnosis not present

## 2016-05-08 DIAGNOSIS — Z96652 Presence of left artificial knee joint: Secondary | ICD-10-CM | POA: Diagnosis present

## 2016-05-08 DIAGNOSIS — I214 Non-ST elevation (NSTEMI) myocardial infarction: Secondary | ICD-10-CM | POA: Diagnosis present

## 2016-05-08 DIAGNOSIS — Z720 Tobacco use: Secondary | ICD-10-CM | POA: Diagnosis not present

## 2016-05-08 DIAGNOSIS — I10 Essential (primary) hypertension: Secondary | ICD-10-CM | POA: Diagnosis present

## 2016-05-08 DIAGNOSIS — I2510 Atherosclerotic heart disease of native coronary artery without angina pectoris: Secondary | ICD-10-CM | POA: Diagnosis not present

## 2016-05-08 DIAGNOSIS — I499 Cardiac arrhythmia, unspecified: Secondary | ICD-10-CM

## 2016-05-08 DIAGNOSIS — R002 Palpitations: Secondary | ICD-10-CM | POA: Diagnosis not present

## 2016-05-08 DIAGNOSIS — Z7982 Long term (current) use of aspirin: Secondary | ICD-10-CM

## 2016-05-08 DIAGNOSIS — F17210 Nicotine dependence, cigarettes, uncomplicated: Secondary | ICD-10-CM | POA: Diagnosis present

## 2016-05-08 DIAGNOSIS — I498 Other specified cardiac arrhythmias: Secondary | ICD-10-CM

## 2016-05-08 DIAGNOSIS — I25110 Atherosclerotic heart disease of native coronary artery with unstable angina pectoris: Secondary | ICD-10-CM | POA: Diagnosis present

## 2016-05-08 DIAGNOSIS — E785 Hyperlipidemia, unspecified: Secondary | ICD-10-CM | POA: Diagnosis present

## 2016-05-08 HISTORY — DX: Essential (primary) hypertension: I10

## 2016-05-08 HISTORY — DX: Tobacco use: Z72.0

## 2016-05-08 HISTORY — DX: Unspecified osteoarthritis, unspecified site: M19.90

## 2016-05-08 HISTORY — DX: Atherosclerotic heart disease of native coronary artery without angina pectoris: I25.10

## 2016-05-08 HISTORY — DX: Nonpyogenic meningitis: G03.0

## 2016-05-08 HISTORY — DX: Hyperlipidemia, unspecified: E78.5

## 2016-05-08 HISTORY — PX: CARDIAC CATHETERIZATION: SHX172

## 2016-05-08 LAB — COMPREHENSIVE METABOLIC PANEL
ALBUMIN: 3.7 g/dL (ref 3.5–5.0)
ALT: 30 U/L (ref 17–63)
ANION GAP: 7 (ref 5–15)
AST: 24 U/L (ref 15–41)
Alkaline Phosphatase: 59 U/L (ref 38–126)
BILIRUBIN TOTAL: 0.8 mg/dL (ref 0.3–1.2)
BUN: 12 mg/dL (ref 6–20)
CHLORIDE: 105 mmol/L (ref 101–111)
CO2: 25 mmol/L (ref 22–32)
Calcium: 9 mg/dL (ref 8.9–10.3)
Creatinine, Ser: 1.14 mg/dL (ref 0.61–1.24)
GFR calc Af Amer: >60 mL/min (ref >60)
GFR calc non Af Amer: >60 mL/min (ref >60)
GLUCOSE: 96 mg/dL (ref 65–99)
POTASSIUM: 3.9 mmol/L (ref 3.5–5.1)
SODIUM: 137 mmol/L (ref 135–145)
TOTAL PROTEIN: 6.1 g/dL — AB (ref 6.5–8.1)

## 2016-05-08 LAB — I-STAT TROPONIN, ED: TROPONIN I, POC: 0.07 ng/mL (ref 0.00–0.08)

## 2016-05-08 LAB — CBC WITH DIFFERENTIAL/PLATELET
BASOS PCT: 0 %
Basophils Absolute: 0 10*3/uL (ref 0.0–0.1)
EOS ABS: 0 10*3/uL (ref 0.0–0.7)
EOS PCT: 0 %
HCT: 45.4 % (ref 39.0–52.0)
HEMOGLOBIN: 15.6 g/dL (ref 13.0–17.0)
LYMPHS ABS: 2.2 10*3/uL (ref 0.7–4.0)
LYMPHS PCT: 16 %
MCH: 31.7 pg (ref 26.0–34.0)
MCHC: 34.4 g/dL (ref 30.0–36.0)
MCV: 92.3 fL (ref 78.0–100.0)
MONO ABS: 0.5 10*3/uL (ref 0.1–1.0)
MONOS PCT: 4 %
NEUTROS ABS: 10.7 10*3/uL — AB (ref 1.7–7.7)
Neutrophils Relative %: 80 %
PLATELETS: 224 10*3/uL (ref 150–400)
RBC: 4.92 MIL/uL (ref 4.22–5.81)
RDW: 12.9 % (ref 11.5–15.5)
WBC: 13.4 10*3/uL — ABNORMAL HIGH (ref 4.0–10.5)

## 2016-05-08 LAB — POCT ACTIVATED CLOTTING TIME
ACTIVATED CLOTTING TIME: 285 s
Activated Clotting Time: 246 seconds

## 2016-05-08 LAB — PLATELET COUNT: Platelets: 236 10*3/uL (ref 150–400)

## 2016-05-08 LAB — TROPONIN I: Troponin I: 1.41 ng/mL (ref <0.03)

## 2016-05-08 LAB — TSH: TSH: 0.652 u[IU]/mL (ref 0.350–4.500)

## 2016-05-08 SURGERY — LEFT HEART CATH AND CORONARY ANGIOGRAPHY
Anesthesia: LOCAL

## 2016-05-08 MED ORDER — SODIUM CHLORIDE 0.9% FLUSH: 3.0000 mL | INTRAVENOUS | Status: DC | PRN

## 2016-05-08 MED ORDER — METOPROLOL TARTRATE 25 MG PO TABS
25.0000 mg | ORAL_TABLET | Freq: Two times a day (BID) | ORAL | Status: DC
  Administered 2016-05-08 – 2016-05-09 (×2): 25 mg via ORAL
  Filled 2016-05-08 (×2): qty 1

## 2016-05-08 MED ORDER — NITROGLYCERIN 1 MG/10 ML FOR IR/CATH LAB
INTRA_ARTERIAL | Status: DC | PRN
Start: 2016-05-08 — End: 2016-05-08
  Administered 2016-05-08 (×2): 200 ug via INTRACORONARY

## 2016-05-08 MED ORDER — TIROFIBAN HCL IN NACL 5-0.9 MG/100ML-% IV SOLN
0.1500 ug/kg/min | INTRAVENOUS | Status: DC
  Filled 2016-05-08 (×2): qty 100

## 2016-05-08 MED ORDER — FENTANYL CITRATE (PF) 100 MCG/2ML IJ SOLN
INTRAMUSCULAR | Status: AC
  Filled 2016-05-08: qty 2

## 2016-05-08 MED ORDER — SODIUM CHLORIDE 0.9% FLUSH: 3.0000 mL | Freq: Two times a day (BID) | INTRAVENOUS | Status: DC

## 2016-05-08 MED ORDER — SODIUM CHLORIDE 0.9 % IV SOLN: INTRAVENOUS | Status: DC

## 2016-05-08 MED ORDER — SODIUM CHLORIDE 0.9 % IV SOLN: 250.0000 mL | INTRAVENOUS | Status: DC | PRN

## 2016-05-08 MED ORDER — IOPAMIDOL (ISOVUE-370) INJECTION 76%
INTRAVENOUS | Status: AC
  Filled 2016-05-08: qty 100

## 2016-05-08 MED ORDER — HEPARIN SODIUM (PORCINE) 1000 UNIT/ML IJ SOLN
INTRAMUSCULAR | Status: DC | PRN
  Administered 2016-05-08: 4000 [IU] via INTRAVENOUS
  Administered 2016-05-08: 5000 [IU] via INTRAVENOUS
  Administered 2016-05-08: 2000 [IU] via INTRAVENOUS

## 2016-05-08 MED ORDER — SODIUM CHLORIDE 0.9 % IV SOLN
INTRAVENOUS | Status: DC | PRN
  Administered 2016-05-08: 120 mL/h via INTRAVENOUS

## 2016-05-08 MED ORDER — ACTIVE PARTNERSHIP FOR HEALTH OF YOUR HEART BOOK
Freq: Once | Status: AC
  Administered 2016-05-08: 20:00:00
  Filled 2016-05-08: qty 1

## 2016-05-08 MED ORDER — FENTANYL CITRATE (PF) 100 MCG/2ML IJ SOLN
INTRAMUSCULAR | Status: DC | PRN
  Administered 2016-05-08: 50 ug via INTRAVENOUS

## 2016-05-08 MED ORDER — IOPAMIDOL (ISOVUE-370) INJECTION 76%
INTRAVENOUS | Status: AC
  Filled 2016-05-08: qty 50

## 2016-05-08 MED ORDER — HEPARIN SODIUM (PORCINE) 1000 UNIT/ML IJ SOLN
INTRAMUSCULAR | Status: AC
  Filled 2016-05-08: qty 1

## 2016-05-08 MED ORDER — HEPARIN (PORCINE) IN NACL 2-0.9 UNIT/ML-% IJ SOLN
INTRAMUSCULAR | Status: AC
  Filled 2016-05-08: qty 1000

## 2016-05-08 MED ORDER — TIROFIBAN HCL IN NACL 5-0.9 MG/100ML-% IV SOLN
0.1500 ug/kg/min | INTRAVENOUS | Status: DC
  Administered 2016-05-08: 23:00:00 0.15 ug/kg/min via INTRAVENOUS
  Filled 2016-05-08 (×3): qty 100

## 2016-05-08 MED ORDER — TICAGRELOR 90 MG PO TABS
ORAL_TABLET | ORAL | Status: DC | PRN
  Administered 2016-05-08: 180 mg via ORAL

## 2016-05-08 MED ORDER — TIROFIBAN (AGGRASTAT) BOLUS VIA INFUSION
INTRAVENOUS | Status: DC | PRN
  Administered 2016-05-08: 3005 ug via INTRAVENOUS

## 2016-05-08 MED ORDER — IOPAMIDOL (ISOVUE-370) INJECTION 76%
INTRAVENOUS | Status: DC | PRN
Start: 2016-05-08 — End: 2016-05-08
  Administered 2016-05-08: 195 mL via INTRA_ARTERIAL

## 2016-05-08 MED ORDER — VERAPAMIL HCL 2.5 MG/ML IV SOLN
INTRAVENOUS | Status: DC | PRN
  Administered 2016-05-08: 10 mL via INTRA_ARTERIAL

## 2016-05-08 MED ORDER — MIDAZOLAM HCL 2 MG/2ML IJ SOLN
INTRAMUSCULAR | Status: AC
  Filled 2016-05-08: qty 2

## 2016-05-08 MED ORDER — LIDOCAINE HCL (PF) 1 % IJ SOLN
INTRAMUSCULAR | Status: DC | PRN
Start: 2016-05-08 — End: 2016-05-08
  Administered 2016-05-08: 2 mL

## 2016-05-08 MED ORDER — ASPIRIN 81 MG PO TABS: 81.0000 mg | ORAL_TABLET | Freq: Every day | ORAL | Status: DC

## 2016-05-08 MED ORDER — LIDOCAINE HCL (PF) 1 % IJ SOLN
INTRAMUSCULAR | Status: AC
  Filled 2016-05-08: qty 30

## 2016-05-08 MED ORDER — TICAGRELOR 90 MG PO TABS
90.0000 mg | ORAL_TABLET | Freq: Two times a day (BID) | ORAL | Status: DC
  Administered 2016-05-09: 05:00:00 90 mg via ORAL
  Filled 2016-05-08: qty 1

## 2016-05-08 MED ORDER — TIROFIBAN HCL IN NACL 5-0.9 MG/100ML-% IV SOLN
INTRAVENOUS | Status: AC
  Filled 2016-05-08: qty 100

## 2016-05-08 MED ORDER — VERAPAMIL HCL 2.5 MG/ML IV SOLN
INTRAVENOUS | Status: AC
  Filled 2016-05-08: qty 2

## 2016-05-08 MED ORDER — NITROGLYCERIN 0.4 MG SL SUBL
0.4000 mg | SUBLINGUAL_TABLET | SUBLINGUAL | Status: DC | PRN
  Administered 2016-05-08: 0.4 mg via SUBLINGUAL

## 2016-05-08 MED ORDER — TIROFIBAN HCL IN NACL 5-0.9 MG/100ML-% IV SOLN
INTRAVENOUS | Status: DC | PRN
  Administered 2016-05-08: 0.15 ug/kg/min via INTRAVENOUS

## 2016-05-08 MED ORDER — ONDANSETRON HCL 4 MG/2ML IJ SOLN: 4.0000 mg | Freq: Four times a day (QID) | INTRAMUSCULAR | Status: DC | PRN

## 2016-05-08 MED ORDER — MIDAZOLAM HCL 2 MG/2ML IJ SOLN
INTRAMUSCULAR | Status: DC | PRN
  Administered 2016-05-08: 2 mg via INTRAVENOUS

## 2016-05-08 MED ORDER — ATORVASTATIN CALCIUM 80 MG PO TABS
80.0000 mg | ORAL_TABLET | Freq: Every day | ORAL | Status: DC
  Administered 2016-05-08 – 2016-05-09 (×2): 80 mg via ORAL
  Filled 2016-05-08 (×2): qty 1

## 2016-05-08 MED ORDER — HEPARIN (PORCINE) IN NACL 2-0.9 UNIT/ML-% IJ SOLN
INTRAMUSCULAR | Status: DC | PRN
  Administered 2016-05-08: 1000 mL

## 2016-05-08 MED ORDER — NITROGLYCERIN 1 MG/10 ML FOR IR/CATH LAB
INTRA_ARTERIAL | Status: AC
  Filled 2016-05-08: qty 10

## 2016-05-08 MED ORDER — ANGIOPLASTY BOOK
Freq: Once | Status: AC
  Administered 2016-05-08: 20:00:00
  Filled 2016-05-08: qty 1

## 2016-05-08 MED ORDER — ASPIRIN EC 81 MG PO TBEC
81.0000 mg | DELAYED_RELEASE_TABLET | Freq: Every day | ORAL | Status: DC
  Administered 2016-05-09: 81 mg via ORAL
  Filled 2016-05-08: qty 1

## 2016-05-08 MED ORDER — NITROGLYCERIN 0.4 MG SL SUBL: 0.4000 mg | SUBLINGUAL_TABLET | SUBLINGUAL | Status: DC | PRN

## 2016-05-08 MED ORDER — ACETAMINOPHEN 325 MG PO TABS: 650.0000 mg | ORAL_TABLET | ORAL | Status: DC | PRN

## 2016-05-08 MED ORDER — NITROGLYCERIN 0.4 MG SL SUBL
SUBLINGUAL_TABLET | SUBLINGUAL | Status: AC
  Filled 2016-05-08: qty 1

## 2016-05-08 MED ORDER — TICAGRELOR 90 MG PO TABS
ORAL_TABLET | ORAL | Status: AC
  Filled 2016-05-08: qty 2

## 2016-05-08 SURGICAL SUPPLY — 18 items
BALLN EMERGE MR 2.0X12 (BALLOONS) ×2
BALLOON EMERGE MR 2.0X12 (BALLOONS) ×1 IMPLANT
CATH INFINITI 5 FR JL3.5 (CATHETERS) ×2 IMPLANT
CATH INFINITI JR4 5F (CATHETERS) ×2 IMPLANT
CATH VISTA GUIDE 6FR XBLAD3.5 (CATHETERS) ×2 IMPLANT
DEVICE RAD COMP TR BAND LRG (VASCULAR PRODUCTS) ×2 IMPLANT
ELECT DEFIB PAD ADLT CADENCE (PAD) ×2 IMPLANT
GLIDESHEATH SLEND SS 6F .021 (SHEATH) ×2 IMPLANT
KIT ENCORE 26 ADVANTAGE (KITS) ×2 IMPLANT
KIT HEART LEFT (KITS) ×2 IMPLANT
PACK CARDIAC CATHETERIZATION (CUSTOM PROCEDURE TRAY) ×2 IMPLANT
STENT XIENCE ALPINE RX 2.25X15 (Permanent Stent) ×2 IMPLANT
STENT XIENCE ALPINE RX 4.0X15 (Permanent Stent) ×2 IMPLANT
TRANSDUCER W/STOPCOCK (MISCELLANEOUS) ×2 IMPLANT
TUBING CIL FLEX 10 FLL-RA (TUBING) ×2 IMPLANT
WIRE COUGAR XT STRL 190CM (WIRE) ×2 IMPLANT
WIRE RUNTHROUGH .014X180CM (WIRE) ×2 IMPLANT
WIRE SAFE-T 1.5MM-J .035X260CM (WIRE) ×2 IMPLANT

## 2016-05-08 NOTE — Interval H&P Note (Signed)
History and Physical Interval Note:  05/08/2016 4:28 PM  Cameron [name]  has presented today for cardiac cath with the diagnosis of unstable angina.  The various methods of treatment have been discussed with the patient and family. After consideration of risks, benefits and other options for treatment, the patient has consented to  Procedure(s): Left Heart Cath and Coronary Angiography (N/A) as a surgical intervention .  The patient's history has been reviewed, patient examined, no change in status, stable for surgery.  I have reviewed the patient's chart and labs.  Questions were answered to the patient's satisfaction.    Cath Lab Visit (complete for each Cath Lab visit)  Clinical Evaluation Leading to the Procedure:   ACS: Yes.    Non-ACS:    Anginal Classification: CCS III  Anti-ischemic medical therapy: No Therapy  Non-Invasive Test Results: No non-invasive testing performed  Prior CABG: No previous CABG         [name]

## 2016-05-08 NOTE — H&P (Signed)
History & Physical    Patient ID: Cameron [name] MRN: [id number], DOB/AGE: 02-08-1966   Admit date: 05/08/2016   Primary Physician: Chevis Pretty, FNP Primary Cardiologist: new - seen by C. Angelena Form, MD  Patient Profile    50 y/o ? without prior cardiac hx who presented to the ED today 2/2 chest pain.  Past Medical History    Past Medical History:  Diagnosis Date  . Aseptic meningitis    a. 06/2003.  . Osteoarthritis    a. s/p L Total Knee Arthroplasty.  . Tobacco abuse    a. occasional cigarette - approx 1 pack/month.    Past Surgical History:  Procedure Laterality Date  . JOINT REPLACEMENT Left 11/12   knee  . KNEE SURGERY      Allergies  No Known Allergies  History of Present Illness    50 y/o ? with no prior h/o CAD.  His father had PAD @ a young age and died during a lower extremity bypass operation believed to be 2/2 MI.  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  He is very active, exercising regularly, without limitations.  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  Afterwards, ~ 45 mins after the physical portion of the class, he was reviewing with the class when he had sudden onset of severe sscp/pressure radiating down both arms and up the left side of his face.  He went to his truck and chewed three aspirin but continued to have discomfort.  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  There, ECG was non-acute.  He was treated with sl NTG with some, though not complete relief.  He was taken via EMS to the ED where f/u ECG showed early repol.  Chest pain eventually resolved w/o further treatment (total duration ~ 2 hrs).  Initial POC trop returned mildly elevated @ 0.07.  He is currently pain free.  Home Medications    Prior to Admission medications   Medication Sig Start Date End Date Taking? Authorizing Provider  aspirin 325 MG tablet Take 975 mg by mouth once.   Yes Historical  Provider, MD  aspirin 81 MG tablet Take 81 mg by mouth daily.   Yes Historical Provider, MD  atorvastatin (LIPITOR) 40 MG tablet Take 1 tablet (40 mg total) by mouth daily. 02/27/15   Sharion Balloon, FNP    Family History    Family History  Problem Relation Age of Onset  . Thyroid disease Mother   . Cancer Mother     moles- removed  . Stroke Father     died in mid 37's during lower ext bypass    Social History    Social History   Social History  . Marital status: Married    Spouse name: N/A  . Number of children: N/A  . Years of education: N/A   Occupational History  . 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 Smoking status: Current Some Day Smoker    Last attempt to quit: 01/27/2001  . Smokeless tobacco: Current User     Comment: smokes about 1 pack of cigarettes/month.  . Alcohol use Yes     Comment: occasional.  . Drug use: No  . Sexual activity: Yes   Other Topics Concern  . Not on file   Social History Narrative   Lives in [place] with wife and children.  Regularly exercises.     Review of Systems    General:  No chills, fever, night sweats or weight changes.  Cardiovascular:  +++ chest pain, no dyspnea on exertion, edema, orthopnea, palpitations, paroxysmal nocturnal dyspnea. Dermatological: No rash, lesions/masses Respiratory: No cough, dyspnea Urologic: No hematuria, dysuria Abdominal:   No nausea, vomiting, diarrhea, bright red blood per rectum, melena, or hematemesis Neurologic:  No visual changes, wkns, changes in mental status. All other systems reviewed and are otherwise negative except as noted above.  Physical Exam    Blood pressure 122/63, pulse 67, resp. rate 19, height 6\' 3"  (1.905 m), weight 265 lb (120.2 kg), SpO2 100 %.  General: Pleasant, NAD Psych: Normal affect. Neuro: Alert and oriented X 3. Moves all extremities spontaneously. HEENT: Normal  Neck: Supple without bruits or JVD. Lungs:  Resp regular and  unlabored, CTA. Heart: RRR no s3, s4, or murmurs. Abdomen: Soft, non-tender, non-distended, BS + x 4.  Extremities: No clubbing, cyanosis or edema. DP/PT/Radials 2+ and equal bilaterally.  Labs    Troponin [facility] Test)  Recent Labs  05/08/16 1437  TROPIPOC 0.07   Lab Results  Component Value Date   WBC 13.4 (H) 05/08/2016   HGB 15.6 05/08/2016   HCT 45.4 05/08/2016   MCV 92.3 05/08/2016   PLT 224 05/08/2016     Recent Labs Lab 05/08/16 1415  NA 137  K 3.9  CL 105  CO2 25  BUN 12  CREATININE 1.14  CALCIUM 9.0  PROT 6.1*  BILITOT 0.8  ALKPHOS 59  ALT 30  AST 24  GLUCOSE 96   Lab Results  Component Value Date   CHOL 188 02/24/2015   HDL 34 (L) 02/24/2015   LDLCALC Comment 02/24/2015   TRIG 402 (H) 02/24/2015    Radiology Studies    Dg Chest Port 1 View  Result Date: 05/08/2016 CLINICAL DATA:  Substernal chest pain. EXAM: PORTABLE CHEST 1 VIEW COMPARISON:  None. FINDINGS: 1459 hours. Low volume film. The lungs are clear wiithout focal pneumonia, edema, pneumothorax or pleural effusion. The cardiopericardial silhouette is within normal limits for size. The visualized bony structures of the thorax are intact. Telemetry leads overlie the chest. IMPRESSION: Low volume film without acute cardiopulmonary findings. Electronically Signed   By: Misty Stanley M.D.   On: 05/08/2016 15:10   ECG & Cardiac Imaging    RSR, 70, early repol, no acute changes.  Assessment & Plan    1.  NSTEMI: Pt presented to the ED after developing sscp with radiation down bilat arm and up left side of the face, about 45 mins after strenuous activity.  Ss persisted for ~ two hours and did improve after sl ntg.  He is currently pain free.  ECG is notable for early repol.  POC troponin is mildly elevated @ 0.07.  RF include FH of premature vascular dzs and possible premature CAD/MI in his father.  In light of symptoms and enzymatic changes, we will plan to proceed with diagnostic cath this  afternoon.  The patient understands that risks include but are not limited to stroke (1 in 1000), death (1 in 45), kidney failure [usually temporary] (1 in 500), bleeding (1 in 200), allergic reaction [possibly serious] (1 in 200), and agrees to proceed.  Add asa, heparin bolus (in lab), high potency statin.  BP is soft currently so no  blocker.  2.  HL:  ? Prior hx.  He was once Rx a statin but never took b/c  f/u lipids were apparently better (first set was nonfasting).  F/u lipids/lft's here.  Adding high potency statin in setting of ACS as above.  3.  Tob Abuse:  Smokes one or two cigarettes socially.  About 1 pack/month.  Complete cessation advised.  Signed, Murray Hodgkins, NP 05/08/2016, 4:16 PM   I have personally seen and examined this patient with Ignacia Bayley, NP. I agree with the assessment and plan as outlined above. He is a smoker with chest pain today. This is c/w unstable angina. Troponin mildly elevated. EKG shows non-specific ST changes. Exam shows normally developed male in NAD, CV RRR, LUngs: clear bilaterally, No LE edema. Labs reviewed and renal function is ok. Will plan cardiac cath with possible PCI today.   [name] 05/08/2016 4:22 PM

## 2016-05-08 NOTE — ED Notes (Signed)
Iv  Ns  tko   20  Angio    r hand  1  Att  Site  Is  Patent    Placed  On  Cardiac  Monitor  And  Nasal  o2  At  2  l  /  Min

## 2016-05-08 NOTE — ED Notes (Signed)
Pt    Developed  Chest  Pain   And  Felt  Like  Was   Skipping a  Beat  Today  After  Some  Mild  excercise   At  One  Point the  Pain  Was  1  7   It  Subsided     He  Is  Awake and   Alert  No  History  Of  Any  Heart   Disease

## 2016-05-08 NOTE — Progress Notes (Signed)
TR BAND REMOVAL  LOCATION:    right radial  DEFLATED PER PROTOCOL:    Yes.    TIME BAND OFF / DRESSING APPLIED:    21:45   SITE UPON ARRIVAL:    Level 1  SITE AFTER BAND REMOVAL:    Level 0  CIRCULATION SENSATION AND MOVEMENT:    Within Normal Limits   Yes.    COMMENTS:  TR band instructions given. Pt tolerated well.

## 2016-05-08 NOTE — ED Provider Notes (Signed)
[facility] DEPT Provider Note   CSN: [id number] Arrival date & time: 05/08/16  1346     History   Chief Complaint Chief Complaint  Patient presents with  . Chest Pain    HPI Cameron [name] is a 50 y.o. male.  Tightness in anterior chest with tingling in bilateral hands after doing some moderate exercises this morning. No history of hypertension, diabetes, CAD, hyperlipidemia.  He is a smoker. His father died at 10 from a "blood clot in his leg that travelled to his heart".  He took 3 regular strength aspirin and EMS gave him 3) which seem to help the pain. He was evaluated at the urgent care center and sent to the [facility]. His pain has reduced from a 7 to 1 at the present time.      History reviewed. No pertinent past medical history.  Patient Active Problem List   Diagnosis Date Noted  . Left knee pain 02/26/2011    Past Surgical History:  Procedure Laterality Date  . JOINT REPLACEMENT Left 11/12   knee  . KNEE SURGERY         Home Medications    Prior to Admission medications   Medication Sig Start Date End Date Taking? Authorizing Provider  aspirin 325 MG tablet Take 975 mg by mouth once.   Yes Historical Provider, MD  aspirin 81 MG tablet Take 81 mg by mouth daily.   Yes Historical Provider, MD  atorvastatin (LIPITOR) 40 MG tablet Take 1 tablet (40 mg total) by mouth daily. 02/27/15   Sharion Balloon, FNP    Family History Family History  Problem Relation Age of Onset  . Thyroid disease Mother   . Cancer Mother     moles- removed  . Stroke Father     Social History Social History  Substance Use Topics  . Smoking status: Current Some Day Smoker    Last attempt to quit: 01/27/2001  . Smokeless tobacco: Current User  . Alcohol use Yes     Allergies   Review of patient's allergies indicates no known allergies.   Review of Systems Review of Systems  All other systems reviewed and are negative.    Physical Exam Updated Vital Signs BP 109/61    Pulse 73   Resp 17   Ht 6\' 3"  (1.905 m)   Wt 265 lb (120.2 kg)   SpO2 99%   BMI 33.12 kg/m   Physical Exam  Constitutional: He appears well-developed and well-nourished.  HENT:  Head: Normocephalic and atraumatic.  Eyes: Conjunctivae are normal.  Neck: Neck supple.  Cardiovascular: Normal rate and regular rhythm.   No murmur heard. Pulmonary/Chest: Effort normal and breath sounds normal. No respiratory distress.  Abdominal: Soft. There is no tenderness.  Musculoskeletal: He exhibits no edema.  Neurological: He is alert.  Skin: Skin is warm and dry.  Psychiatric: He has a normal mood and affect.  Nursing note and vitals reviewed.    ED Treatments / Results  Labs (all labs ordered are listed, but only abnormal results are displayed) Labs Reviewed  CBC WITH DIFFERENTIAL/PLATELET - Abnormal; Notable for the following:       Result Value   WBC 13.4 (*)    Neutro Abs 10.7 (*)    All other components within normal limits  COMPREHENSIVE METABOLIC PANEL  I-STAT TROPOININ, ED  I-STAT TROPOININ, ED    EKG  EKG Interpretation None      Date: 05/08/2016  Rate: 70  Rhythm: normal sinus  rhythm  QRS Axis: normal  Intervals: normal  ST/T Wave abnormalities: normal  Conduction Disutrbances: none  Narrative Interpretation: unremarkable     Radiology No results found.  Procedures Procedures (including critical care time)  Medications Ordered in ED Medications - No data to display   Initial Impression / Assessment and Plan / ED Course  I have reviewed the triage vital signs and the nursing notes.  Pertinent labs & imaging results that were available during my care of the patient were reviewed by me and considered in my medical decision making (see chart for details).  Clinical Course    Patient presents with chest pain which is atypical for him.  He does have cardiac risk factors. EKG shows no acute changes. Will consult cardiology.  Final Clinical  Impressions(s) / ED Diagnoses   Final diagnoses:  Chest pain, unspecified chest pain type    New Prescriptions New Prescriptions   No medications on file     Nat Christen, MD 05/08/16 1517

## 2016-05-08 NOTE — ED Notes (Signed)
Consent signed, belongings given to wife- clothes, cellphone, wallet.

## 2016-05-08 NOTE — ED Triage Notes (Signed)
Pt arrives from [facility] via [facility]. 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 Pt endorses radiation to left arm, blurred vision, diaphoresis and lightheadedness during the episode. Pt took 3 baby ASA then went to UC where they gave him 3 nitro which took his pain from a 7/10 to a 1/10.

## 2016-05-08 NOTE — ED Notes (Signed)
Report  Phoned    To    [facility]

## 2016-05-08 NOTE — ED Provider Notes (Signed)
[facility]    CSN: [id number] Arrival date & time: 05/08/16  1220  First Provider Contact:  First MD Initiated Contact with Patient 05/08/16 1258     History   Chief Complaint Chief Complaint  Patient presents with  . Chest Pain   HPI Cameron [name] is a 50 y.o. male presenting for chest tightness.   He reports substernal chest pain described as "tightness" shortly after exercise this morning, approximately 1 hr prior to assessment. This was associated with lightheadedness, and L > R arm tingling. He took 3 ASA and when it failed to subside, came for evaluation. He denies history of HTN, HLD, DM, TIA, and takes no medications. + FH for father having CVD. Occasional smoker (when drinking).  Still having the chest tightness at a lower level.   History reviewed. No pertinent past medical history.  Patient Active Problem List   Diagnosis Date Noted  . Left knee pain 02/26/2011    Past Surgical History:  Procedure Laterality Date  . JOINT REPLACEMENT Left 11/12   knee  . KNEE SURGERY      Home Medications    Prior to Admission medications   Medication Sig Start Date End Date Taking? Authorizing Provider  aspirin 81 MG tablet Take 81 mg by mouth daily.   Yes Historical Provider, MD  atorvastatin (LIPITOR) 40 MG tablet Take 1 tablet (40 mg total) by mouth daily. 02/27/15   Sharion Balloon, FNP  cyclobenzaprine (FLEXERIL) 10 MG tablet Take 1 tablet (10 mg total) by mouth 3 (three) times daily as needed for muscle spasms. 04/20/15   Mary-Margaret Hassell Done, FNP  oxyCODONE-acetaminophen (PERCOCET) 5-325 MG tablet Take 1-2 tablets by mouth every 4 (four) hours as needed. 07/28/15   Orpah Greek, MD    Family History Family History  Problem Relation Age of Onset  . Thyroid disease Mother   . Cancer Mother     moles- removed  . Stroke Father     Social History Social History  Substance Use Topics  . Smoking status: Current Some Day Smoker    Last attempt to  quit: 01/27/2001  . Smokeless tobacco: Not on file  . Alcohol use Yes     Allergies   Review of patient's allergies indicates no known allergies.   Review of Systems Review of Systems As above  Physical Exam Triage Vital Signs ED Triage Vitals  Enc Vitals Group     BP 05/08/16 1253 140/96     Pulse Rate 05/08/16 1253 92     Resp --      Temp 05/08/16 1253 98.6 F (37 C)     Temp Source 05/08/16 1253 Oral     SpO2 05/08/16 1253 100 %     Weight --      Height --      Head Circumference --      Peak Flow --      Pain Score 05/08/16 1256 2     Pain Loc --      Pain Edu? --      Excl. in [place]? --    No data found.  Updated Vital Signs BP 140/96 (BP Location: Left Arm)   Pulse 92   Temp 98.6 F (37 C) (Oral)   SpO2 100%   Physical Exam  Constitutional: He is oriented to person, place, and time. He appears well-developed and well-nourished.  mild distress  Eyes: EOM are normal. Pupils are equal, round, and reactive to light.  Neck: Neck supple. No JVD present.  Cardiovascular: Normal rate, normal heart sounds and intact distal pulses.  Exam reveals no gallop and no friction rub.   No murmur heard. occasional dropped beats  Pulmonary/Chest: Effort normal and breath sounds normal. No respiratory distress. He has no wheezes. He has no rales. He exhibits no tenderness.  Abdominal: Soft. Bowel sounds are normal.  Musculoskeletal: He exhibits no edema.  Neurological: He is alert and oriented to person, place, and time. He exhibits normal muscle tone.  Skin: Skin is warm and dry.  Psychiatric:  anxious affect  Vitals reviewed.  UC Treatments / Results  Labs (all labs ordered are listed, but only abnormal results are displayed) Labs Reviewed - No data to display  EKG  EKG Interpretation None     ECG: NSR, normal axis, no ST segment changes. T wave abnormalities suggestive of LVH/strain. No conduction delay. No old ECG for comparison.   Radiology No results  found.  Procedures Procedures (including critical care time)  Medications Ordered in UC Medications  nitroGLYCERIN (NITROSTAT) SL tablet 0.4 mg (0.4 mg Sublingual Given 05/08/16 1313)     Initial Impression / Assessment and Plan / UC Course  I have reviewed the triage vital signs and the nursing notes.  Pertinent labs & imaging results that were available during my care of the patient were reviewed by me and considered in my medical decision making (see chart for details).  Final Clinical Impressions(s) / UC Diagnoses   Final diagnoses:  Nonspecific chest pain  Palpitations  Arrhythmia, AV node   50 y.o. male presenting for ongoing substernal chest tightness beginning following exertion. ECG here is not diagnostic of ACS. Exam and rhythm strip reveal occasional dropped beats, and pt reports new onset palpitations. He is an occasional smoker. Troponin not checked due to possibility of false negative ~ 1 hr after onset of symptoms.  - Transfer to ED for further ACS r/o. Pt hemodynamically stable at this time.  - Start IV, O2, NTG. Already administered ASA.   New Prescriptions New Prescriptions   No medications on file     Patrecia Pour, MD 05/08/16 1318

## 2016-05-09 ENCOUNTER — Other Ambulatory Visit (HOSPITAL_COMMUNITY): Payer: Self-pay

## 2016-05-09 ENCOUNTER — Inpatient Hospital Stay (HOSPITAL_COMMUNITY): Payer: 59

## 2016-05-09 ENCOUNTER — Encounter (HOSPITAL_COMMUNITY): Payer: Self-pay | Admitting: Internal Medicine

## 2016-05-09 DIAGNOSIS — E785 Hyperlipidemia, unspecified: Secondary | ICD-10-CM | POA: Diagnosis present

## 2016-05-09 DIAGNOSIS — I10 Essential (primary) hypertension: Secondary | ICD-10-CM | POA: Diagnosis present

## 2016-05-09 DIAGNOSIS — I2510 Atherosclerotic heart disease of native coronary artery without angina pectoris: Secondary | ICD-10-CM | POA: Diagnosis present

## 2016-05-09 LAB — CBC
HCT: 47.6 % (ref 39.0–52.0)
Hemoglobin: 16.2 g/dL (ref 13.0–17.0)
MCH: 31.6 pg (ref 26.0–34.0)
MCHC: 34 g/dL (ref 30.0–36.0)
MCV: 93 fL (ref 78.0–100.0)
PLATELETS: 235 10*3/uL (ref 150–400)
RBC: 5.12 MIL/uL (ref 4.22–5.81)
RDW: 13 % (ref 11.5–15.5)
WBC: 12.1 10*3/uL — AB (ref 4.0–10.5)

## 2016-05-09 LAB — ECHOCARDIOGRAM COMPLETE
E decel time: 215 msec
FS: 33 % (ref 28–44)
Height: 75 in
IV/PV OW: 1
LA ID, A-P, ES: 44 mm
LA diam index: 1.78 cm/m2
LA vol A4C: 66.4 ml
LAVOL: 68.1 mL
LAVOLIN: 27.6 mL/m2
LEFT ATRIUM END SYS DIAM: 44 mm
LV PW d: 10 mm — AB (ref 0.6–1.1)
LV TDI E'MEDIAL: 7.62
LVELAT: 12.1 cm/s
LVOT area: 5.31 cm2
LVOT diameter: 26 mm
Lateral S' vel: 11.7 cm/s
MV Dec: 215
MVPKEVEL: 1.5 m/s
TAPSE: 23.4 mm
TDI e' lateral: 12.1
Weight: 4222.25 oz

## 2016-05-09 LAB — LIPID PANEL
CHOL/HDL RATIO: 4.9 ratio
Cholesterol: 158 mg/dL (ref 0–200)
HDL: 32 mg/dL — AB (ref >40)
LDL CALC: 55 mg/dL (ref 0–99)
Triglycerides: 354 mg/dL — ABNORMAL HIGH (ref <150)
VLDL: 71 mg/dL — AB (ref 0–40)

## 2016-05-09 LAB — COMPREHENSIVE METABOLIC PANEL
ALK PHOS: 60 U/L (ref 38–126)
ALT: 33 U/L (ref 17–63)
AST: 38 U/L (ref 15–41)
Albumin: 3.8 g/dL (ref 3.5–5.0)
Anion gap: 8 (ref 5–15)
BUN: 13 mg/dL (ref 6–20)
CHLORIDE: 106 mmol/L (ref 101–111)
CO2: 22 mmol/L (ref 22–32)
CREATININE: 1.01 mg/dL (ref 0.61–1.24)
Calcium: 9.2 mg/dL (ref 8.9–10.3)
GFR calc Af Amer: >60 mL/min (ref >60)
GFR calc non Af Amer: >60 mL/min (ref >60)
Glucose, Bld: 99 mg/dL (ref 65–99)
Potassium: 4.1 mmol/L (ref 3.5–5.1)
SODIUM: 136 mmol/L (ref 135–145)
Total Bilirubin: 0.8 mg/dL (ref 0.3–1.2)
Total Protein: 6.2 g/dL — ABNORMAL LOW (ref 6.5–8.1)

## 2016-05-09 LAB — PLATELET COUNT: PLATELETS: 234 10*3/uL (ref 150–400)

## 2016-05-09 LAB — TROPONIN I
Troponin I: 2.71 ng/mL (ref <0.03)
Troponin I: 1.64 ng/mL (ref <0.03)

## 2016-05-09 MED ORDER — NITROGLYCERIN 0.4 MG SL SUBL: 0.4000 mg | SUBLINGUAL_TABLET | SUBLINGUAL | 12 refills | Status: AC | PRN

## 2016-05-09 MED ORDER — METOPROLOL TARTRATE 25 MG PO TABS: 25.0000 mg | ORAL_TABLET | Freq: Two times a day (BID) | ORAL | 6 refills | Status: DC

## 2016-05-09 MED ORDER — ATORVASTATIN CALCIUM 80 MG PO TABS: 80.0000 mg | ORAL_TABLET | Freq: Every day | ORAL | 3 refills | Status: DC

## 2016-05-09 MED ORDER — TICAGRELOR 90 MG PO TABS: 90.0000 mg | ORAL_TABLET | Freq: Two times a day (BID) | ORAL | 3 refills | Status: DC

## 2016-05-09 NOTE — Discharge Instructions (Signed)

## 2016-05-09 NOTE — Progress Notes (Signed)
Patient Name: Cameron [name] Date of Encounter: 05/09/2016     Principal Problem:   NSTEMI (non-ST elevated myocardial infarction) [facility]) Active Problems:   Tobacco abuse    SUBJECTIVE  Feeling the best he has in weeks. No CP or SOB. Would love to go home today  CURRENT MEDS . aspirin EC  81 mg Oral Daily  . atorvastatin  80 mg Oral Daily  . metoprolol tartrate  25 mg Oral BID  . sodium chloride flush  3 mL Intravenous Q12H  . sodium chloride flush  3 mL Intravenous Q12H  . sodium chloride flush  3 mL Intravenous Q12H  . ticagrelor  90 mg Oral BID    OBJECTIVE  Vitals:   05/08/16 2030 05/08/16 2100 05/08/16 2200 05/09/16 0420  BP: (!) 118/51 (!) 115/59 (!) 109/58 108/68  Pulse:    61  Resp: 18 18 17 13   Temp:    97.1 F (36.2 C)  TempSrc:    Oral  SpO2: 99% 98% 98% 95%  Weight:    263 lb 14.3 oz (119.7 kg)  Height:        Intake/Output Summary (Last 24 hours) at 05/09/16 0631 Last data filed at 05/09/16 0421  Gross per 24 hour  Intake          1063.73 ml  Output             1500 ml  Net          -436.27 ml   Filed Weights   05/08/16 1400 05/09/16 0420  Weight: 265 lb (120.2 kg) 263 lb 14.3 oz (119.7 kg)    PHYSICAL EXAM  General: Pleasant, NAD. Neuro: Alert and oriented X 3. Moves all extremities spontaneously. Psych: Normal affect. HEENT:  Normal  Neck: Supple without bruits or JVD. Lungs:  Resp regular and unlabored, CTA. Heart: RRR no s3, s4, or murmurs. Abdomen: Soft, non-tender, non-distended, BS + x 4.  Extremities: No clubbing, cyanosis or edema. DP/PT/Radials 2+ and equal bilaterally.  Accessory Clinical Findings  CBC  Recent Labs  05/08/16 1415 05/08/16 1937 05/09/16 0039  WBC 13.4*  --   --   NEUTROABS 10.7*  --   --   HGB 15.6  --   --   HCT 45.4  --   --   MCV 92.3  --   --   PLT 224 236 [id number]   Basic Metabolic Panel  Recent Labs  05/08/16 1415  NA 137  K 3.9  CL 105  CO2 25  GLUCOSE 96  BUN 12  CREATININE 1.14    CALCIUM 9.0   Liver Function Tests  Recent Labs  05/08/16 1415  AST 24  ALT 30  ALKPHOS 59  BILITOT 0.8  PROT 6.1*  ALBUMIN 3.7   No results for input(s): LIPASE, AMYLASE in the last 72 hours. Cardiac Enzymes  Recent Labs  05/08/16 1910 05/09/16 0039  TROPONINI 1.41* 2.71*   Fasting Lipid Panel  Recent Labs  05/09/16 0039  CHOL 158  HDL 32*  LDLCALC 55  TRIG 354*  CHOLHDL 4.9   Thyroid Function Tests  Recent Labs  05/08/16 1918  TSH 0.652    TELE  NSR with PVCs  Radiology/Studies  Dg Chest Port 1 View  Result Date: 05/08/2016 CLINICAL DATA:  Substernal chest pain. EXAM: PORTABLE CHEST 1 VIEW COMPARISON:  None. FINDINGS: 1459 hours. Low volume film. The lungs are clear wiithout focal pneumonia, edema, pneumothorax or pleural effusion. The cardiopericardial silhouette is  within normal limits for size. The visualized bony structures of the thorax are intact. Telemetry leads overlie the chest. IMPRESSION: Low volume film without acute cardiopulmonary findings. Electronically Signed   By: Misty Stanley M.D.   On: 05/08/2016 15:10   Dg Fluoro Guided Needle Plc Aspiration/injection Loc  Result Date: 04/26/2016 CLINICAL DATA:  LEFT knee pain. FLUOROSCOPY TIME:  49 seconds corresponding to a Dose Area Product of 25.33 Gy*m2 PROCEDURE: LEFT PROXIMAL TIBIA FIBULAR JOINT INJECTION UNDER FLUOROSCOPY Informed written consent was obtained.  Time-out was performed. An appropriate skin entrance site was determined. The site was marked, prepped with Betadine, draped in the usual sterile fashion, and infiltrated locally with 1% lidocaine. A 25 gauge hypodermic needle was advanced into the joint while injecting additional 1% lidocaine. Loss of resistance was obtained. Contrast injection confirmed spread throughout the joint. I injected 60 mg Kenalog. The patient tolerated the procedure well. IMPRESSION: Technically successful LEFT proximal tibia fibular joint injection under  fluoroscopy. Electronically Signed   By: Staci Righter M.D.   On: 04/26/2016 11:16   Procedures 05/08/16 Coronary Stent Intervention  Left Heart Cath and Coronary Angiography  Conclusion    Prox LAD to Mid LAD lesion, 80 %stenosed.  Post intervention, there is a 0% residual stenosis.  A STENT XIENCE ALPINE RX [id number] drug eluting stent was successfully placed.  Ost Ramus to Ramus lesion, 95 %stenosed.  Post intervention, there is a 0% residual stenosis.  A STENT XIENCE ALPINE RX [id number] drug eluting stent was successfully placed.  LV end diastolic pressure is mildly elevated.   1. Severe double vessel CAD 2. NSTEMI/Unstable angina 3. Severe thrombotic stenosis mid LAD 4. Severe stenosis proximal Ramus Intermediate branch 5. Successful PTCA/DES x 1 mid LAD 6. Successful PTCA/DES x 1 proximal Ramus intermediate  Recommendations: Will continue DAPT with ASA and Brilinta. Will start high intensity statin. Echo in am. Will start beta blocker. Continue tirofiban infusion for 6 hours post-cath.       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   NSTEMI: peak troponin 2.71. Patient underwent [facility] on 05/08/16 which showed severe double vessel CAD with prox-mid LAD 80% thrombotic stenosis s/p PCI/DES, oRI 95% sten s/p PCI/DES. Plan is for DAPT with ASA and Brilinta. Started on high intensity statin and BB. Radial site looks good. Plan for 2D ECHO this AM.  HLD: LDL already at goal without statin therapy; however HDL low at 32 and TG high at 354 (? Fasting ). Continue high intensity statin in the setting of CAD  HTN: some very mildly elevated BPs yesterday ~ 146/89. Started on Lopressor 25 mg BID in the setting of CAD. Now well controlled.   Tobacco abuse: he understands the importance of complete cessation.  Dispo:  will await echo this AM. Possible DC later today.    Signed, Angelena Form PA-C  Pager [phone]  Patient seen, examined. Available data reviewed. Agree with findings, assessment, and plan as outlined by Angelena Form, PA-C. Exam reveals pt alert, oriented, NAD. Lungs CTA, heart RRR without murmur, extremities without edema, right radial site clear. Cath report reviewed and pt s/p successful 2 vessel PCI. Post-PCI instructions reviewed with patient, including importance of med adherence especially with DAPT (ASA and brilinta x 12 months). Echo images reviewed and LV function appears in low  normal range. No appreciable valvular disease. Formal report pending will review when available. OK for DC this am.   Sherren Mocha, M.D. 05/09/2016 9:38 AM

## 2016-05-09 NOTE — Progress Notes (Signed)
  Echocardiogram 2D Echocardiogram has been performed.  Cameron [name] 05/09/2016, 8:39 AM

## 2016-05-09 NOTE — Care Management Note (Signed)
Case Management Note  Patient Details  Name: MARTICE HELLUMS MRN: [id number] Date of Birth: 04/26/1966  Subjective/Objective:     Patient is from home, pta inde, s/p stent intervention, on Brilinta, co pay is $50.00 NCM informed patient on phone of what co pay is.  NCM gave patient 30 day savings card and wife called the pharmacy they use to verify if they have in stock , they did have brilinta in stock.    Per benefit check;   /W NIYOKIE @ [facility] # [phone]   BRILINTA 90 MG BID (30 )   COVER- YES  CO-PAY- $ 50.00  TIER- 3 DRUG  PRIOR APPROVAL - NO  [facility]              Action/Plan:   Expected Discharge Date:                  Expected Discharge Plan:  Home/Self Care  In-House Referral:     Discharge planning Services  CM Consult  Post Acute Care Choice:    Choice offered to:     DME Arranged:    DME Agency:     HH Arranged:    [place] Agency:     Status of Service:  Completed, signed off  If discussed at [employer], dates discussed:    Additional Comments:  Zenon Mayo, RN 05/09/2016, 10:52 AM

## 2016-05-09 NOTE — Discharge Summary (Signed)
Discharge Summary    Patient ID: Cameron [name],  MRN: [id number], DOB/AGE: Dec 16, 1965 50 y.o.  Admit date: 05/08/2016 Discharge date: 05/09/2016  Primary Care Provider: Mary-Margaret Hassell Done Primary Cardiologist: Dr. 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   He previously had no h/o CAD. His father had PAD @ a young age and died during a lower extremity bypass operation believed to be 2/2 MI.  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  He is very active, exercising regularly, without limitations. 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 Afterwards, ~ 45 mins after the physical portion of the class, he was reviewing with the class when he had sudden onset of severe sscp/pressure radiating down both arms and up the left side of his face.  He went to his truck and chewed three aspirin but continued to have discomfort.  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  There, ECG was non-acute.  He was treated with sl NTG with some, though not complete relief.  He was taken via EMS to the ED where f/u ECG showed early repol.  Chest pain eventually resolved w/o further treatment (total duration ~ 2 hrs).  Initial POC trop returned mildly elevated @ 0.07.    He was taken back for cath on the day of admission which showed severe double vessel CAD with prox-mid LAD 80% thrombotic stenosis s/p PCI/DES, oRI 95% sten s/p PCI/DES. He was placed on DAPT with ASA and Brilinta and  started on high intensity statin and BB. 2D ECHO performed on the morning of discharge was pending final read. Dr. Burt Knack reviewed images and LV function appeared in low normal range with no valvular abnormalities.     Hospital Course     Consultants: none  NSTEMI: peak troponin 2.71. Patient underwent [place] on 05/08/16 which showed severe double vessel CAD with prox-mid LAD 80% thrombotic stenosis s/p PCI/DES, oRI 95% sten s/p PCI/DES. Plan is for DAPT with ASA and Brilinta. Started on high intensity statin and BB. Radial site looks good. 2D ECHO performed this AM and final read still pending. Dr. Burt Knack reviewed images and LV function appears in low normal range with no valvular abnormalities.   HLD: LDL already at goal without statin therapy; however HDL low at 32 and TG high at 354 (? Fasting ). Continue high intensity statin in the setting of CAD  HTN: some very mildly elevated BPs yesterday ~ 146/89. Started on Lopressor 25 mg BID in the setting of CAD. Now well controlled.   Tobacco abuse: he understands the importance of complete cessation.   The patient has had an uncomplicated hospital course and is recovering well. The radial catheter site is stable. He has been seen by Dr. Burt Knack today and deemed ready for discharge home. All follow-up appointments have been scheduled. Smoking cessation was disscussed in length. A written RX for a  30 day free supply of Brilinta was provided for the patient. Discharge medications are listed below.  _____________  Discharge Vitals Blood pressure 129/79, pulse 67, temperature 97.6 F (36.4 C), temperature source Oral, resp. rate (!) 25, height 6\' 3"  (1.905 m), weight 263 lb 14.3 oz (119.7 kg), SpO2 95 %.  Filed Weights   2016/05/30 1400 05/09/16 0420  Weight: 265 lb (120.2 kg) 263 lb 14.3 oz (119.7 kg)    Labs & Radiologic Studies     CBC  Recent Labs  May 30, 2016 1415  05/09/16 0039 05/09/16 0616  WBC 13.4*  --   --  12.1*  NEUTROABS  10.7*  --   --   --   HGB 15.6  --   --  16.2  HCT 45.4  --   --  47.6  MCV 92.3  --   --  93.0  PLT 224  < > 234 235  < > = values in this interval not displayed. Basic Metabolic Panel  Recent Labs  May 30, 2016 1415 05/09/16 0616  NA 137 136  K 3.9 4.1  CL 105 106  CO2 25 22  GLUCOSE 96 99  BUN 12 13  CREATININE 1.14 1.01  CALCIUM 9.0 9.2   Liver Function Tests  Recent Labs  05/30/16 1415 05/09/16 0616  AST 24 38  ALT 30 33  ALKPHOS 59 60  BILITOT 0.8 0.8  PROT 6.1* 6.2*  ALBUMIN 3.7 3.8   No results for input(s): LIPASE, AMYLASE in the last 72 hours. Cardiac Enzymes  Recent Labs  05/30/2016 1910 05/09/16 0039 05/09/16 0616  TROPONINI 1.41* 2.71* 1.64*   BNP Invalid input(s): POCBNP D-Dimer No results for input(s): DDIMER in the last 72 hours. Hemoglobin A1C No results for input(s): HGBA1C in the last 72 hours. Fasting Lipid Panel  Recent Labs  05/09/16 0039  CHOL 158  HDL 32*  LDLCALC 55  TRIG 354*  CHOLHDL 4.9   Thyroid Function Tests  Recent Labs  05/30/2016 1918  TSH 0.652    Dg Chest Port 1 View  Result Date: May 30, 2016 CLINICAL DATA:  Substernal chest pain. EXAM: PORTABLE CHEST 1 VIEW COMPARISON:  None. FINDINGS: 1459 hours. Low volume film. The lungs are clear wiithout focal pneumonia, edema, pneumothorax or pleural effusion. The cardiopericardial silhouette is within normal limits for size. The visualized bony structures of the thorax are intact. Telemetry leads overlie the chest. IMPRESSION: Low volume film without acute cardiopulmonary findings. Electronically Signed   By: Misty Stanley M.D.   On: May 30, 2016 15:10   Dg Fluoro Guided Needle Plc Aspiration/injection Loc  Result Date: 04/26/2016 CLINICAL DATA:  LEFT knee pain. FLUOROSCOPY TIME:  49 seconds corresponding to a Dose Area Product of 25.33 Gy*m2 PROCEDURE: LEFT PROXIMAL TIBIA FIBULAR JOINT INJECTION UNDER FLUOROSCOPY Informed written consent was obtained.  Time-out was performed.  An appropriate skin entrance site was determined. The site was marked, prepped with Betadine, draped in the usual sterile fashion, and infiltrated locally with 1% lidocaine. A 25 gauge hypodermic needle was advanced into the joint while injecting additional 1% lidocaine. Loss of resistance was obtained. Contrast injection confirmed spread throughout the joint. I injected 60 mg Kenalog. The patient tolerated the procedure well. IMPRESSION: Technically successful LEFT proximal tibia fibular joint injection under fluoroscopy. Electronically Signed   By: Staci Righter M.D.   On: 04/26/2016 11:16     Diagnostic Studies/Procedures    Procedures 2016-05-30 Coronary Stent Intervention  Left Heart Cath and Coronary Angiography  Conclusion  Prox LAD to Mid LAD lesion, 80 %stenosed.  Post intervention, there is a 0% residual stenosis.  A STENT XIENCE ALPINE RX 4.0X15 drug eluting stent was successfully placed.  Ost Ramus to Ramus lesion, 95 %stenosed.  Post intervention, there is a 0% residual stenosis.  A STENT XIENCE ALPINE RX [id number] drug eluting stent was successfully placed.  LV end diastolic pressure is mildly elevated.  1. Severe double vessel CAD 2. NSTEMI/Unstable angina 3. Severe thrombotic stenosis mid LAD 4. Severe stenosis proximal Ramus Intermediate branch 5. Successful PTCA/DES x 1 mid LAD 6. Successful PTCA/DES x 1 proximal Ramus intermediate  Recommendations: Will continue DAPT with ASA and Brilinta. Will start high intensity statin. Echo in am. Will start beta blocker. Continue tirofiban infusion for 6 hours post-cath.      _____________    Disposition   Pt is being discharged home today in good condition.  Follow-up Plans & Appointments    Follow-up Information    Angelena Form, PA-C. Go on 05/17/2016.   Specialties:  Cardiology, Radiology Why:  @ 10:30am  Contact information: [place] [phone]            Discharge Instructions    Amb Referral to Cardiac Rehabilitation    Complete by:  As directed   Diagnosis:   NSTEMI Coronary Stents        Discharge Medications     Medication List    TAKE these medications   aspirin 81 MG tablet Take 81 mg by mouth daily. What changed:  Another medication with the same name was removed. Continue taking this medication, and follow the directions you see here. Notes to patient:  Prevents clotting in stent and heart attack   atorvastatin 80 MG tablet Commonly known as:  LIPITOR Take 1 tablet (80 mg total) by mouth daily at 6 PM. What changed:  medication strength  how much to take  when to take this   metoprolol tartrate 25 MG tablet Commonly known as:  LOPRESSOR Take 1 tablet (25 mg total) by mouth 2 (two) times daily.   nitroGLYCERIN 0.4 MG SL tablet Commonly known as:  NITROSTAT Place 1 tablet (0.4 mg total) under the tongue every 5 (five) minutes x 3 doses as needed for chest pain.   ticagrelor 90 MG Tabs tablet Commonly known as:  BRILINTA Take 1 tablet (90 mg total) by mouth 2 (two) times daily.       Aspirin prescribed at discharge?  Yes High Intensity Statin Prescribed? (Lipitor 40-80mg  or Crestor 20-40mg ): Yes Beta Blocker Prescribed? Yes For EF 45% or less, Was ACEI/ARB Prescribed? No: EF normal ADP Receptor Inhibitor Prescribed? (i.e. Plavix etc.-Includes Medically Managed Patients): Yes For EF <45%, Aldosterone Inhibitor Prescribed? No: EF normal Was EF assessed during THIS hospitalization? Yes Was Cardiac Rehab II ordered? (Included Medically managed Patients): Yes   Outstanding Labs/Studies   none  Duration of Discharge Encounter   Greater than 30 minutes including physician time.  Signed, Angelena Form PA-C 05/09/2016, 10:08 AM

## 2016-05-09 NOTE — Progress Notes (Signed)
CARDIAC REHAB PHASE I   PRE:  Rate/Rhythm: 39 SB  BP:  Supine:   Sitting: 122/73  Standing:    SaO2:   MODE:  Ambulation: 800 ft   POST:  Rate/Rhythm: 71 SR  BP:  Supine:   Sitting: 129/79  Standing:    SaO2:  0830-0925 Pt walked 800 ft with steady gait. No CP. Tolerated well. MI education completed with pt and wife who voiced understanding. Stressed importance of brilinta with pt and case manager saw pt and gave card. Reviewed NTG use, risk factors, ex ed, MI restrictions, and tobacco cessation. Gave pt smoking cessation handout. Pt uses smokeless tobacco more than smoking. Encouraged him to quit as still getting nicotine afftects.  Discussed CRP 2 and will refer to [place]. Pt stated very active and usually adheres to healthy diet.    Graylon Good, RN BSN  05/09/2016 9:23 AM

## 2016-05-16 ENCOUNTER — Encounter: Payer: Self-pay | Admitting: Physician Assistant

## 2016-05-16 NOTE — Progress Notes (Signed)
Cardiology Office Note    Date:  05/17/2016   ID:  Cameron [name], DOB 1966/05/31, MRN [id number]  PCP:  Chevis Pretty, FNP  Cardiologist: Dr. 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  He initially presented to [facility] on 05/08/16 with chest pain and ruled in for NSTEMI. He previously had no h/o CAD. His father had PAD @ a young age and died during a lower extremity bypass operation believed to be 2/2 MI. 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 He is very active, exercising regularly, without limitations. 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 He went to his truck and chewed three aspirin but continued to have discomfort. 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 There, ECG was non-acute. He was treated with sl NTG with some, though not complete relief. He was taken via EMS to the ED where f/u ECG showed early repol. Chest pain eventually resolved w/o further treatment (total duration ~ 2 hrs). Initial POC trop returned mildly elevated @ 0.07. Trop eventually peaked around 2.7. He was taken back for cath on the day of admission which showed severe double vessel CAD with prox-mid LAD 80% thrombotic stenosis s/p PCI/DES, oRI 95% sten s/p PCI/DES. He was placed on DAPT with ASA and Brilinta and started on high intensity statin and BB. 2D ECHO showed normal EF with no wall motion or valvular abnormalities.   Today he presents to clinic for follow up. Since going home he has been feeling great, the best he has felt in months. He  has been walking 30 minutes twice a day with no symptoms. No LE edema, orthopnea or PND. No palpitations. No other issues.  Ready to go back to work.    Past Medical History:  Diagnosis Date  . Aseptic meningitis    a. 06/2003.  Marland Kitchen CAD (coronary artery disease)    a. NSTEMI: [facility] on 05/08/16 which showed severe double vessel CAD with prox-mid LAD 80% thrombotic stenosis s/p PCI/DES, oRI 95% sten s/p PCI/DES.  [place] HLD (hyperlipidemia)   . HTN (hypertension)   . Osteoarthritis    a. s/p L Total Knee Arthroplasty.  . Tobacco abuse    a. occasional cigarette - approx 1 pack/month.    Past Surgical History:  Procedure Laterality Date  . CARDIAC CATHETERIZATION N/A 05/08/2016   Procedure: Left Heart Cath and Coronary Angiography;  Surgeon: Nelva Bush, MD;  Location: [place] [facility];  Service: Cardiovascular;  Laterality: N/A;  . CARDIAC CATHETERIZATION N/A 05/08/2016   Procedure: Coronary Stent Intervention;  Surgeon: Nelva Bush, MD;  Location: [place] [facility];  Service: Cardiovascular;  Laterality: N/A;  Mid LAD Ostial/Prox Ramus  . JOINT REPLACEMENT Left 11/12   knee  . KNEE SURGERY      Current Medications: Outpatient Medications Prior to Visit  Medication Sig Dispense Refill  . aspirin 81 MG tablet Take 81 mg by mouth daily.    [place] Kitchen atorvastatin (LIPITOR) 80 MG tablet Take 1  tablet (80 mg total) by mouth daily at 6 PM. 90 tablet 3  . metoprolol tartrate (LOPRESSOR) 25 MG tablet Take 1 tablet (25 mg total) by mouth 2 (two) times daily. 60 tablet 6  . nitroGLYCERIN (NITROSTAT) 0.4 MG SL tablet Place 1 tablet (0.4 mg total) under the tongue every 5 (five) minutes x 3 doses as needed for chest pain. 25 tablet 12  . ticagrelor (BRILINTA) 90 MG TABS tablet Take 1 tablet (90 mg total) by mouth 2 (two) times daily. 180 tablet 3   No facility-administered medications prior to visit.      Allergies:   Review of patient's allergies indicates no known allergies.   Social History     Social History  . Marital status: Married    Spouse name: N/A  . Number of children: N/A  . Years of education: N/A   Occupational History  . 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 Smoking status: Former Smoker    Quit date: 01/27/2001  . Smokeless tobacco: Current User     Comment: smokes about 1 pack of cigarettes/month.  . Alcohol use Yes     Comment: occasional.  . Drug use: No  . Sexual activity: Yes   Other Topics Concern  . None   Social History Narrative   Lives in [place] with wife and children.  Regularly exercises.     Family History:  The patient's family history includes Cancer in his mother; Heart attack in his father; Stroke in his father; Thyroid disease in his mother.     ROS:   Please see the history of present illness.    ROS All other systems reviewed and are negative.   PHYSICAL EXAM:   VS:  BP 119/70   Pulse 62   Ht 6\' 3"  (1.905 m)   Wt 264 lb 12.8 oz (120.1 kg)   BMI 33.10 kg/m    GEN: Well nourished, well developed, in no acute distress  HEENT: normal  Neck: no JVD, carotid bruits, or masses Cardiac: RRR; no murmurs, rubs, or gallops,no edema  Respiratory:  clear to auscultation bilaterally, normal work of breathing GI: soft, nontender, nondistended, + BS MS: no deformity or atrophy  Skin: warm and dry, no rash Neuro:  Alert and Oriented x 3, Strength and sensation are intact Psych: euthymic mood, full affect  Wt Readings from Last 3 Encounters:  05/17/16 264 lb 12.8 oz (120.1 kg)  05/09/16 263 lb 14.3 oz (119.7 kg)  07/28/15 255 lb (115.7 kg)      Studies/Labs Reviewed:   EKG:  EKG is ordered today.   Recent Labs: 05/08/2016: TSH 0.652 05/09/2016: ALT 33; BUN 13; Creatinine, Ser 1.01; Hemoglobin 16.2; Platelets 235; Potassium 4.1; Sodium 136   Lipid Panel    Component Value Date/Time   CHOL 158 05/09/2016 0039   CHOL 188 02/24/2015 1307   TRIG 354 (H) 05/09/2016 0039   TRIG 121 11/01/2013 1019    HDL 32 (L) 05/09/2016 0039   HDL 34 (L) 02/24/2015 1307   HDL 41 11/01/2013 1019   CHOLHDL 4.9 05/09/2016 0039   VLDL 71 (H) 05/09/2016 0039   LDLCALC 55 05/09/2016 0039   LDLCALC Comment 02/24/2015 1307   LDLCALC 105 (H) 11/01/2013 1019    Additional studies/ records that were reviewed today include:  Procedures 05/08/16 Coronary Stent Intervention  Left Heart Cath and Coronary Angiography  Conclusion    Prox LAD to Mid LAD lesion, 80 %stenosed.  Post  intervention, there is a 0% residual stenosis.  A STENT XIENCE ALPINE RX 4.0X15 drug eluting stent was successfully placed.  Ost Ramus to Ramus lesion, 95 %stenosed.  Post intervention, there is a 0% residual stenosis.  A STENT XIENCE ALPINE RX [id number] drug eluting stent was successfully placed.  LV end diastolic pressure is mildly elevated.  1. Severe double vessel CAD 2. NSTEMI/Unstable angina 3. Severe thrombotic stenosis mid LAD 4. Severe stenosis proximal Ramus Intermediate branch 5. Successful PTCA/DES x 1 mid LAD 6. Successful PTCA/DES x 1 proximal Ramus intermediate  Recommendations: Will continue DAPT with ASA and Brilinta. Will start high intensity statin. Echo in am. Will start beta blocker. Continue tirofiban infusion for 6 hours post-cath.     2D ECHO: 05/09/2016 LV EF: 60% -   65% Study Conclusions - Left ventricle: The cavity size was normal. Systolic function was   normal. The estimated ejection fraction was in the range of 60%   to 65%. Wall motion was normal; there were no regional wall   motion abnormalities. Left ventricular diastolic function   parameters were normal. Doppler parameters are consistent with   indeterminate ventricular filling pressure. - Aortic valve: Transvalvular velocity was within the normal range.   There was no stenosis. There was no regurgitation. - Mitral valve: Transvalvular velocity was within the normal range.   There was no evidence for stenosis. There was  trivial   regurgitation. - Right ventricle: The cavity size was normal. Wall thickness was   normal. Systolic function was normal. - Tricuspid valve: There was trivial regurgitation. - Pulmonary arteries: Systolic pressure was within the normal   range. PA peak pressure: 27 mm Hg (S).   ASSESSMENT & PLAN:   CAD: recent NSTEMI with peak troponin 2.71. Patient underwent [place] on 05/08/16 which showed severe double vessel CAD with prox-mid LAD 80% thrombotic stenosis s/p PCI/DES, oRI 95% sten s/p PCI/DES. Plan is for DAPT with ASA and Brilinta. Started on high intensity statin and BB.   HLD: LDL already at goal without statin therapy; however HDL low at 32 and TG high at 354 (? Fasting ). Continue high intensity statin in the setting of CAD  HTN: BP well controlled on Lopressor 25 mg BID in the setting of CAD.   Tobacco abuse: he understands the importance of complete cessation.   Medication Adjustments/Labs and Tests Ordered: Current medicines are reviewed at length with the patient today.  Concerns regarding medicines are outlined above.  Medication changes, Labs and Tests ordered today are listed in the Patient Instructions below. Patient Instructions  Your physician recommends that you continue on your current medications as directed. Please refer to the Current Medication list given to you today.   Your physician recommends that you schedule a follow-up appointment in:  [place]     Signed, Angelena Form, PA-C  05/17/2016 11:55 AM    [address], [place], [place]  [address] Phone: [phone]; Fax: [phone]

## 2016-05-17 ENCOUNTER — Encounter: Payer: Self-pay | Admitting: Physician Assistant

## 2016-05-17 ENCOUNTER — Ambulatory Visit (INDEPENDENT_AMBULATORY_CARE_PROVIDER_SITE_OTHER): Payer: 59 | Admitting: Physician Assistant

## 2016-05-17 ENCOUNTER — Encounter: Payer: Self-pay | Admitting: *Deleted

## 2016-05-17 VITALS — BP 119/70 | HR 62 | Ht 75.0 in | Wt 264.8 lb

## 2016-05-17 DIAGNOSIS — E785 Hyperlipidemia, unspecified: Secondary | ICD-10-CM

## 2016-05-17 DIAGNOSIS — I10 Essential (primary) hypertension: Secondary | ICD-10-CM | POA: Diagnosis not present

## 2016-05-17 DIAGNOSIS — Z720 Tobacco use: Secondary | ICD-10-CM | POA: Diagnosis not present

## 2016-05-17 DIAGNOSIS — I2510 Atherosclerotic heart disease of native coronary artery without angina pectoris: Secondary | ICD-10-CM | POA: Diagnosis not present

## 2016-05-17 NOTE — Patient Instructions (Addendum)
Your physician recommends that you continue on your current medications as directed. Please refer to the Current Medication list given to you today.   Your physician recommends that you schedule a follow-up appointment in:  3 MONTHS WITH DR MCALHANY  

## 2016-08-26 ENCOUNTER — Ambulatory Visit (INDEPENDENT_AMBULATORY_CARE_PROVIDER_SITE_OTHER): Payer: 59 | Admitting: Cardiovascular Disease

## 2016-08-26 ENCOUNTER — Encounter: Payer: Self-pay | Admitting: Cardiovascular Disease

## 2016-08-26 VITALS — BP 122/82 | HR 70 | Ht 75.0 in | Wt 275.0 lb

## 2016-08-26 DIAGNOSIS — E7800 Pure hypercholesterolemia, unspecified: Secondary | ICD-10-CM

## 2016-08-26 DIAGNOSIS — I2510 Atherosclerotic heart disease of native coronary artery without angina pectoris: Secondary | ICD-10-CM

## 2016-08-26 DIAGNOSIS — I10 Essential (primary) hypertension: Secondary | ICD-10-CM | POA: Diagnosis not present

## 2016-08-26 NOTE — Patient Instructions (Signed)
Medication Instructions:  Your physician recommends that you continue on your current medications as directed. Please refer to the Current Medication list given to you today.   Labwork: none  Testing/Procedures: none  Follow-Up: Your physician recommends that you schedule a follow-up appointment in: 6 months. Please call our office in about 3 months to schedule this    Any Other Special Instructions Will Be Listed Below (If Applicable).     If you need a refill on your cardiac medications before your next appointment, please call your pharmacy.   

## 2016-08-26 NOTE — Progress Notes (Signed)
Chief Complaint  Patient presents with  . Coronary Artery Disease  . Hyperlipidemia  . Hypertension     History of Present Illness: 50 yo male with history of CAD, HTN, HLD, tobacco use who is here today for cardiac follow up. He was admitted to [facility] August 2017 with a NSTEMI. Cardiac cath with severe stenosis proximal LAD treated with a drug eluting stent and severe stenosis ostial Ramus intermediate branch which was treated with a drug eluting stent. Echo August 2017 with normal LV systolic function, no valve disese.   He is here today for follow up. He has no chest pain or dyspnea. He is exercising 5 days per week.   Primary Care Physician: Chevis Pretty, FNP   Past Medical History:  Diagnosis Date  . Aseptic meningitis    a. 06/2003.  Marland Kitchen CAD (coronary artery disease)    a. NSTEMI: [facility] on 05/08/16 which showed severe double vessel CAD with prox-mid LAD 80% thrombotic stenosis s/p PCI/DES, oRI 95% sten s/p PCI/DES.  Marland Kitchen HLD (hyperlipidemia)   . HTN (hypertension)   . Osteoarthritis    a. s/p L Total Knee Arthroplasty.  . Tobacco abuse    a. occasional cigarette - approx 1 pack/month.    Past Surgical History:  Procedure Laterality Date  . CARDIAC CATHETERIZATION N/A 05/08/2016   Procedure: Left Heart Cath and Coronary Angiography;  Surgeon: Nelva Bush, MD;  Location: [place] [facility];  Service: Cardiovascular;  Laterality: N/A;  . CARDIAC CATHETERIZATION N/A 05/08/2016   Procedure: Coronary Stent Intervention;  Surgeon: Nelva Bush, MD;  Location: [place] [facility];  Service: Cardiovascular;  Laterality: N/A;  Mid LAD Ostial/Prox Ramus  . JOINT REPLACEMENT Left 11/12   knee  . KNEE SURGERY      Current Outpatient Prescriptions  Medication Sig Dispense Refill  . aspirin 81 MG tablet Take 81 mg by mouth daily.    Marland Kitchen atorvastatin (LIPITOR) 80 MG tablet Take 1 tablet (80 mg total) by mouth daily at 6 PM. 90 tablet 3  . metoprolol tartrate (LOPRESSOR) 25 MG  tablet Take 1 tablet (25 mg total) by mouth 2 (two) times daily. 60 tablet 6  . nitroGLYCERIN (NITROSTAT) 0.4 MG SL tablet Place 1 tablet (0.4 mg total) under the tongue every 5 (five) minutes x 3 doses as needed for chest pain. 25 tablet 12  . ticagrelor (BRILINTA) 90 MG TABS tablet Take 1 tablet (90 mg total) by mouth 2 (two) times daily. 180 tablet 3   No current facility-administered medications for this visit.     No Known Allergies  Social History   Social History  . Marital status: Married    Spouse name: N/A  . Number of children: N/A  . Years of education: N/A   Occupational History  . 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 Smoking status: Former Smoker    Quit date: 01/27/2001  . Smokeless tobacco: Current User    Types: Snuff     Comment: smokes about 1 pack of cigarettes/month.  . Alcohol use Yes     Comment: occasional.  . Drug use: No  . Sexual activity: Yes   Other Topics Concern  . Not on file   Social History Narrative   Lives in [place] with wife and children.  Regularly exercises.    Family History  Problem Relation Age of Onset  . Thyroid disease Mother   . Cancer Mother     moles- removed  .  Stroke Father     died in mid 51's during lower ext bypass  . Heart attack Father     Review of Systems:  As stated in the HPI and otherwise negative.   BP 122/82   Pulse 70   Ht 6\' 3"  (1.905 m)   Wt 275 lb (124.7 kg)   BMI 34.37 kg/m   Physical Examination: General: Well developed, well nourished, NAD  HEENT: OP clear, mucus membranes moist  SKIN: warm, dry. No rashes. Neuro: No focal deficits  Musculoskeletal: Muscle strength 5/5 all ext  Psychiatric: Mood and affect normal  Neck: No JVD, no carotid bruits, no thyromegaly, no lymphadenopathy.  Lungs:Clear bilaterally, no wheezes, rhonci, crackles Cardiovascular: Regular rate and rhythm. No murmurs, gallops or rubs. Abdomen:Soft. Bowel sounds present. Non-tender.    Extremities: No lower extremity edema. Pulses are 2 + in the bilateral DP/PT.  Echo August 2017: Left ventricle: The cavity size was normal. Systolic function was   normal. The estimated ejection fraction was in the range of 60%   to 65%. Wall motion was normal; there were no regional wall   motion abnormalities. Left ventricular diastolic function   parameters were normal. Doppler parameters are consistent with   indeterminate ventricular filling pressure. - Aortic valve: Transvalvular velocity was within the normal range.   There was no stenosis. There was no regurgitation. - Mitral valve: Transvalvular velocity was within the normal range.   There was no evidence for stenosis. There was trivial   regurgitation. - Right ventricle: The cavity size was normal. Wall thickness was   normal. Systolic function was normal. - Tricuspid valve: There was trivial regurgitation. - Pulmonary arteries: Systolic pressure was within the normal   range. PA peak pressure: 27 mm Hg (S).  EKG:  EKG is not ordered today. The ekg ordered today demonstrates   Recent Labs: 05/08/2016: TSH 0.652 05/09/2016: ALT 33; BUN 13; Creatinine, Ser 1.01; Hemoglobin 16.2; Platelets 235; Potassium 4.1; Sodium 136   Lipid Panel    Component Value Date/Time   CHOL 158 05/09/2016 0039   CHOL 188 02/24/2015 1307   TRIG 354 (H) 05/09/2016 0039   TRIG 121 11/01/2013 1019   HDL 32 (L) 05/09/2016 0039   HDL 34 (L) 02/24/2015 1307   HDL 41 11/01/2013 1019   CHOLHDL 4.9 05/09/2016 0039   VLDL 71 (H) 05/09/2016 0039   LDLCALC 55 05/09/2016 0039   LDLCALC Comment 02/24/2015 1307   LDLCALC 105 (H) 11/01/2013 1019     Wt Readings from Last 3 Encounters:  08/26/16 275 lb (124.7 kg)  05/17/16 264 lb 12.8 oz (120.1 kg)  05/09/16 263 lb 14.3 oz (119.7 kg)     Other studies Reviewed: Additional studies/ records that were reviewed today include: . Review of the above records demonstrates:   Assessment and Plan:  1.  CAD: He presented with a NSTEMI in August 2017 and had a drug eluting stent placed in the proximal to mid LAD and a drug eluting stent placed in the ostial Ramus intermediate branch. He is on ASA and Brilinta and doing well. He is also on a statin and beta blocker.   2. HTN: BP is well controlled. No changes.   3. Hyperlipidemia: LDL well controlled on statin.   Current medicines are reviewed at length with the patient today.  The patient does not have concerns regarding medicines.  The following changes have been made:  no change  Labs/ tests ordered today include:  No orders of  the defined types were placed in this encounter.    Disposition:   FU with me in 6 months   Signed, [name], MD 08/26/2016 11:08 AM    [facility] [place], [place], [place]  [address] Phone: [phone]; Fax: [phone]

## 2016-10-02 ENCOUNTER — Telehealth: Payer: Self-pay | Admitting: Nurse Practitioner

## 2016-10-31 ENCOUNTER — Telehealth (HOSPITAL_COMMUNITY): Payer: Self-pay | Admitting: Nurse Practitioner

## 2016-10-31 NOTE — Telephone Encounter (Signed)
Mailed ltr with [facility] along with my chart msg... KJ

## 2016-11-22 ENCOUNTER — Ambulatory Visit (INDEPENDENT_AMBULATORY_CARE_PROVIDER_SITE_OTHER): Payer: 59 | Admitting: Nurse Practitioner

## 2016-11-22 ENCOUNTER — Encounter: Payer: Self-pay | Admitting: Nurse Practitioner

## 2016-11-22 VITALS — BP 128/77 | HR 63 | Temp 97.0°F | Ht 75.0 in | Wt 279.0 lb

## 2016-11-22 DIAGNOSIS — Z125 Encounter for screening for malignant neoplasm of prostate: Secondary | ICD-10-CM

## 2016-11-22 DIAGNOSIS — Z0000 Encounter for general adult medical examination without abnormal findings: Secondary | ICD-10-CM | POA: Diagnosis not present

## 2016-11-22 DIAGNOSIS — E7800 Pure hypercholesterolemia, unspecified: Secondary | ICD-10-CM

## 2016-11-22 DIAGNOSIS — Z1211 Encounter for screening for malignant neoplasm of colon: Secondary | ICD-10-CM

## 2016-11-22 DIAGNOSIS — I10 Essential (primary) hypertension: Secondary | ICD-10-CM

## 2016-11-22 DIAGNOSIS — I2510 Atherosclerotic heart disease of native coronary artery without angina pectoris: Secondary | ICD-10-CM

## 2016-11-22 MED ORDER — METOPROLOL TARTRATE 25 MG PO TABS: 25.0000 mg | ORAL_TABLET | Freq: Two times a day (BID) | ORAL | 6 refills | Status: DC

## 2016-11-22 NOTE — Progress Notes (Signed)
Subjective:    Patient ID: Cameron [name], male    DOB: 05-Aug-1966, 51 y.o.   MRN: [id number]  HPI Patient comes in today for annual physical exam. He has had a stressful year. He had  A heart attack  In August of 2017. He had a heart catherization and angioplasty. He completed his rehab and is doing well and is back to work as a Engineer, structural. He has  No complaints today.  Outpatient Encounter Prescriptions as of 11/22/2016  Medication Sig  . aspirin 81 MG tablet Take 81 mg by mouth daily.  Marland Kitchen atorvastatin (LIPITOR) 80 MG tablet Take 1 tablet (80 mg total) by mouth daily at 6 PM.  . metoprolol tartrate (LOPRESSOR) 25 MG tablet Take 1 tablet (25 mg total) by mouth 2 (two) times daily.  . ticagrelor (BRILINTA) 90 MG TABS tablet Take 1 tablet (90 mg total) by mouth 2 (two) times daily.  . nitroGLYCERIN (NITROSTAT) 0.4 MG SL tablet Place 1 tablet (0.4 mg total) under the tongue every 5 (five) minutes x 3 doses as needed for chest pain. (Patient not taking: Reported on 11/22/2016)   No facility-administered encounter medications on file as of 11/22/2016.        Review of Systems  Constitutional: Negative.   HENT: Negative.   Eyes: Negative.   Respiratory: Negative.  Negative for cough and shortness of breath.   Cardiovascular: Negative.  Negative for chest pain, palpitations and leg swelling.  Gastrointestinal: Negative.   Genitourinary: Negative.   Musculoskeletal: Negative.   Neurological: Negative.  Negative for headaches.  Psychiatric/Behavioral: Negative.   All other systems reviewed and are negative.      Objective:   Physical Exam  Constitutional: He is oriented to person, place, and time. He appears well-developed and well-nourished.  HENT:  Head: Normocephalic.  Right Ear: External ear normal.  Left Ear: External ear normal.  Nose: Nose normal.  Mouth/Throat: Oropharynx is clear and moist.  Eyes: EOM are normal. Pupils are equal, round, and reactive to light.  Neck:  Normal range of motion. Neck supple. No JVD present. No thyromegaly present.  Cardiovascular: Normal rate, regular rhythm, normal heart sounds and intact distal pulses.  Exam reveals no gallop and no friction rub.   No murmur heard. Pulmonary/Chest: Effort normal and breath sounds normal. No respiratory distress. He has no wheezes. He has no rales. He exhibits no tenderness.  Abdominal: Soft. Bowel sounds are normal. He exhibits no mass. There is no tenderness.  Genitourinary: Prostate normal and penis normal.  Musculoskeletal: Normal range of motion. He exhibits no edema.  Lymphadenopathy:    He has no cervical adenopathy.  Neurological: He is alert and oriented to person, place, and time. No cranial nerve deficit.  Skin: Skin is warm and dry.  Psychiatric: He has a normal mood and affect. His behavior is normal. Judgment and thought content normal.   BP 128/77   Pulse 63   Temp 97 F (36.1 C) (Oral)   Ht 6' 3"  (1.905 m)   Wt 279 lb (126.6 kg)   BMI 34.87 kg/m        Assessment & Plan:  1. Annual physical exam  - CBC with Differential/Platelet  2. Pure hypercholesterolemia Low cholesterol diet - Lipid panel  3. Coronary artery disease involving native coronary artery of native heart without angina pectoris Keep follow up with cardiology - metoprolol tartrate (LOPRESSOR) 25 MG tablet; Take 1 tablet (25 mg total) by mouth 2 (two) times daily.  Dispense: 60 tablet; Refill: 6  4. Essential hypertension Low sodium diet - CMP14+EGFR  5. Prostate cancer screening - PSA, total and free  6. Colon cancer screening - Ambulatory referral to Gastroenterology    Labs pending Health maintenance reviewed Diet and exercise encouraged Continue all meds Follow up  In 1 year   Mary-Margaret Hassell Done, FNP

## 2016-11-22 NOTE — Patient Instructions (Signed)
Cholesterol Cholesterol is a fat. Your body needs a small amount of cholesterol. Cholesterol (plaque) may build up in your blood vessels (arteries). That makes you more likely to have a heart attack or stroke. You cannot feel your cholesterol level. Having a blood test is the only way to find out if your level is high. Keep your test results. Work with your doctor to keep your cholesterol at a good level. What do the results mean?  Total cholesterol is how much cholesterol is in your blood.  LDL is bad cholesterol. This is the type that can build up. Try to have low LDL.  HDL is good cholesterol. It cleans your blood vessels and carries LDL away. Try to have high HDL.  Triglycerides are fat that the body can store or burn for energy. What are good levels of cholesterol?  Total cholesterol below 200.  LDL below 100 is good for people who have health risks. LDL below 70 is good for people who have very high risks.  HDL above 40 is good. It is best to have HDL of 60 or higher.  Triglycerides below 150. How can I lower my cholesterol? Diet  Follow your diet program as told by your doctor.  Choose fish, white meat chicken, or turkey that is roasted or baked. Try not to eat red meat, fried foods, sausage, or lunch meats.  Eat lots of fresh fruits and vegetables.  Choose whole grains, beans, pasta, potatoes, and cereals.  Choose olive oil, corn oil, or canola oil. Only use small amounts.  Try not to eat butter, mayonnaise, shortening, or palm kernel oils.  Try not to eat foods with trans fats.  Choose low-fat or nonfat dairy foods.  Drink skim or nonfat milk.  Eat low-fat or nonfat yogurt and cheeses.  Try not to drink whole milk or cream.  Try not to eat ice cream, egg yolks, or full-fat cheeses.  Healthy desserts include angel food cake, ginger snaps, animal crackers, hard candy, popsicles, and low-fat or nonfat frozen yogurt. Try not to eat pastries, cakes, pies, and  cookies. Exercise  Follow your exercise program as told by your doctor.  Be more active. Try gardening, walking, and taking the stairs.  Ask your doctor about ways that you can be more active. Medicine  Take over-the-counter and prescription medicines only as told by your doctor. This information is not intended to replace advice given to you by your health care provider. Make sure you discuss any questions you have with your health care provider. Document Released: 12/06/2008 Document Revised: 04/10/2016 Document Reviewed: 03/21/2016 Elsevier Interactive Patient Education  2017 Elsevier Inc.  

## 2016-11-23 LAB — CBC WITH DIFFERENTIAL/PLATELET
Basophils Absolute: 0 10*3/uL (ref 0.0–0.2)
Basos: 1 %
EOS (ABSOLUTE): 0.3 10*3/uL (ref 0.0–0.4)
EOS: 4 %
HEMATOCRIT: 45.8 % (ref 37.5–51.0)
Hemoglobin: 15.8 g/dL (ref 13.0–17.7)
Immature Grans (Abs): 0 10*3/uL (ref 0.0–0.1)
Immature Granulocytes: 0 %
LYMPHS ABS: 2.1 10*3/uL (ref 0.7–3.1)
Lymphs: 30 %
MCH: 31.2 pg (ref 26.6–33.0)
MCHC: 34.5 g/dL (ref 31.5–35.7)
MCV: 91 fL (ref 79–97)
MONOS ABS: 0.6 10*3/uL (ref 0.1–0.9)
Monocytes: 8 %
Neutrophils Absolute: 4 10*3/uL (ref 1.4–7.0)
Neutrophils: 57 %
Platelets: 247 10*3/uL (ref 150–379)
RBC: 5.06 x10E6/uL (ref 4.14–5.80)
RDW: 13.8 % (ref 12.3–15.4)
WBC: 7.1 10*3/uL (ref 3.4–10.8)

## 2016-11-23 LAB — CMP14+EGFR
A/G RATIO: 2.1 (ref 1.2–2.2)
ALK PHOS: 81 IU/L (ref 39–117)
ALT: 46 IU/L — ABNORMAL HIGH (ref 0–44)
AST: 29 IU/L (ref 0–40)
Albumin: 4.4 g/dL (ref 3.5–5.5)
BILIRUBIN TOTAL: 1 mg/dL (ref 0.0–1.2)
BUN/Creatinine Ratio: 13 (ref 9–20)
BUN: 13 mg/dL (ref 6–24)
CHLORIDE: 101 mmol/L (ref 96–106)
CO2: 24 mmol/L (ref 18–29)
Calcium: 9.3 mg/dL (ref 8.7–10.2)
Creatinine, Ser: 1.03 mg/dL (ref 0.76–1.27)
GFR calc Af Amer: 96 mL/min/{1.73_m2} (ref >59)
GFR calc non Af Amer: 83 mL/min/{1.73_m2} (ref >59)
GLOBULIN, TOTAL: 2.1 g/dL (ref 1.5–4.5)
Glucose: 90 mg/dL (ref 65–99)
POTASSIUM: 4.6 mmol/L (ref 3.5–5.2)
SODIUM: 140 mmol/L (ref 134–144)
Total Protein: 6.5 g/dL (ref 6.0–8.5)

## 2016-11-23 LAB — PSA, TOTAL AND FREE
PSA FREE PCT: 16.4 %
PSA FREE: 0.18 ng/mL
Prostate Specific Ag, Serum: 1.1 ng/mL (ref 0.0–4.0)

## 2016-11-23 LAB — LIPID PANEL
CHOLESTEROL TOTAL: 120 mg/dL (ref 100–199)
Chol/HDL Ratio: 3.1 ratio units (ref 0.0–5.0)
HDL: 39 mg/dL — ABNORMAL LOW (ref >39)
LDL Calculated: 63 mg/dL (ref 0–99)
TRIGLYCERIDES: 91 mg/dL (ref 0–149)
VLDL Cholesterol Cal: 18 mg/dL (ref 5–40)

## 2017-01-15 ENCOUNTER — Telehealth: Payer: Self-pay | Admitting: Cardiovascular Disease

## 2017-02-11 NOTE — Progress Notes (Signed)
Cardiology Office Note    Date:  02/13/2017   ID:  Cameron [name], DOB [birth year], MRN [id number]  PCP:  Chevis Pretty, FNP  Cardiologist:  Dr. Angelena Form  CC: follow up  History of Present Illness:  Cameron [name] is a 51 y.o. male with a history of tobacco abuse, HTN and CAD s/p DES to LAD and RI (04/2016) who presents to clinic for follow up.   He was admitted to [facility] 04/2016 with a NSTEMI. Cardiac cath with severe stenosis proximal LAD treated with a drug eluting stent and severe stenosis ostial Ramus intermediate branch which was treated with a drug eluting stent. Echo August 2017 with normal LV systolic function, no valve disese.   He was seen by Dr. Angelena Form in 08/2016 and doing well exercising 5 days a week.   Today he presents to clinic for follow up. He has had a couple episodes of chest pains. It usually occurs after a work out the next day. It feels like a sharp pain in the right pectoral region that lasts a couple seconds. It was nothing like when he had his MI. He works out doing cardio (biking and elliptical) and weight and never gets chest pain or SOB. No LE edema, orthopnea or PND. No dizziness or syncope. No blood in stool or urine. No palpitations. 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     Past Medical History:  Diagnosis Date  . Aseptic meningitis    a. 06/2003.  Marland Kitchen CAD (coronary artery disease)    a. NSTEMI: [facility] on 05/08/16 which showed severe double vessel CAD with prox-mid LAD 80% thrombotic stenosis s/p PCI/DES, oRI 95% sten s/p PCI/DES.  Marland Kitchen HLD (hyperlipidemia)   . HTN (hypertension)   . Osteoarthritis    a. s/p L Total Knee Arthroplasty.  . Tobacco abuse    a. occasional cigarette - approx 1 pack/month.    Past Surgical History:  Procedure Laterality Date  . CARDIAC CATHETERIZATION N/A 05/08/2016   Procedure: Left Heart Cath and  Coronary Angiography;  Surgeon: Nelva Bush, MD;  Location: [place] [facility];  Service: Cardiovascular;  Laterality: N/A;  . CARDIAC CATHETERIZATION N/A 05/08/2016   Procedure: Coronary Stent Intervention;  Surgeon: Nelva Bush, MD;  Location: [place] [facility];  Service: Cardiovascular;  Laterality: N/A;  Mid LAD Ostial/Prox Ramus  . JOINT REPLACEMENT Left 11/12   knee  . KNEE SURGERY      Current Medications: Outpatient Medications Prior to Visit  Medication Sig Dispense Refill  . aspirin 81 MG tablet Take 81 mg by mouth daily.    Marland Kitchen atorvastatin (LIPITOR) 80 MG tablet Take 1 tablet (80 mg total) by mouth daily at 6 PM. 90 tablet 3  . metoprolol tartrate (LOPRESSOR) 25 MG tablet Take 1 tablet (25 mg total) by mouth 2 (two) times daily. 60 tablet 6  . ticagrelor (BRILINTA) 90 MG TABS tablet Take 1 tablet (90 mg total) by mouth 2 (two) times daily. 180 tablet 3  . nitroGLYCERIN (NITROSTAT) 0.4 MG SL tablet Place 1 tablet (0.4 mg total) under the tongue every 5 (five) minutes x 3 doses as needed for chest pain. (Patient not taking: Reported on 11/22/2016) 25 tablet 12   No facility-administered medications prior to visit.      Allergies:   Patient has no known allergies.   Social History  Social History  . Marital status: Married    Spouse name: N/A  . Number of children: N/A  . Years of education: N/A   Occupational History  . 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 Smoking status: Former Smoker    Quit date: 01/27/2001  . Smokeless tobacco: Current User    Types: Snuff     Comment: smokes about 1 pack of cigarettes/month.  . Alcohol use Yes     Comment: occasional.  . Drug use: No  . Sexual activity: Yes   Other Topics Concern  . None   Social History Narrative   Lives in [place] with wife and children.  Regularly exercises.     Family History:  The patient's family history includes Cancer in his mother; Heart attack in his father;  Stroke in his father; Thyroid disease in his mother.      ROS:   Please see the history of present illness.    ROS All other systems reviewed and are negative.   PHYSICAL EXAM:   VS:  BP 124/70   Pulse 70   Ht 6\' 3"  (1.905 m)   Wt 275 lb 1.9 oz (124.8 kg)   BMI 34.39 kg/m    GEN: Well nourished, well developed, in no acute distress, fit appearing HEENT: normal  Neck: no JVD, carotid bruits, or masses Cardiac: RRR; no murmurs, rubs, or gallops,no edema  Respiratory:  clear to auscultation bilaterally, normal work of breathing GI: soft, nontender, nondistended, + BS MS: no deformity or atrophy  Skin: warm and dry, no rash Neuro:  Alert and Oriented x 3, Strength and sensation are intact Psych: euthymic mood, full affect   Wt Readings from Last 3 Encounters:  02/13/17 275 lb 1.9 oz (124.8 kg)  11/22/16 279 lb (126.6 kg)  08/26/16 275 lb (124.7 kg)      Studies/Labs Reviewed:   EKG:  EKG is ordered today. This demonstrates NSR , HR 63. TWI in lead III similar to previous tracing.   Recent Labs: 05/08/2016: TSH 0.652 05/09/2016: Hemoglobin 16.2 11/22/2016: ALT 46; BUN 13; Creatinine, Ser 1.03; Platelets 247; Potassium 4.6; Sodium 140   Lipid Panel    Component Value Date/Time   CHOL 120 11/22/2016 0956   TRIG 91 11/22/2016 0956   TRIG 121 11/01/2013 1019   HDL 39 (L) 11/22/2016 0956   HDL 41 11/01/2013 1019   CHOLHDL 3.1 11/22/2016 0956   CHOLHDL 4.9 05/09/2016 0039   VLDL 71 (H) 05/09/2016 0039   LDLCALC 63 11/22/2016 0956   LDLCALC 105 (H) 11/01/2013 1019    Additional studies/ records that were reviewed today include:  Procedures 05/08/16 Coronary Stent Intervention  Left Heart Cath and Coronary Angiography  Conclusion    Prox LAD to Mid LAD lesion, 80 %stenosed.  Post intervention, there is a 0% residual stenosis.  A STENT XIENCE ALPINE RX [id number] drug eluting stent was successfully placed.  Ost Ramus to Ramus lesion, 95 %stenosed.  Post  intervention, there is a 0% residual stenosis.  A STENT XIENCE ALPINE RX 7.25D66 drug eluting stent was successfully placed.  LV end diastolic pressure is mildly elevated.  1. Severe double vessel CAD 2. NSTEMI/Unstable angina 3. Severe thrombotic stenosis mid LAD 4. Severe stenosis proximal Ramus Intermediate branch 5. Successful PTCA/DES x 1 mid LAD 6. Successful PTCA/DES x 1 proximal Ramus intermediate  Recommendations: Will continue DAPT with ASA and Brilinta. Will start high intensity statin. Echo in am. Will start beta  blocker. Continue tirofiban infusion for 6 hours post-cath.     2D ECHO: 05/09/2016 LV EF: 60% - 65% Study Conclusions - Left ventricle: The cavity size was normal. Systolic function was normal. The estimated ejection fraction was in the range of 60% to 65%. Wall motion was normal; there were no regional wall motion abnormalities. Left ventricular diastolic function parameters were normal. Doppler parameters are consistent with indeterminate ventricular filling pressure. - Aortic valve: Transvalvular velocity was within the normal range. There was no stenosis. There was no regurgitation. - Mitral valve: Transvalvular velocity was within the normal range. There was no evidence for stenosis. There was trivial regurgitation. - Right ventricle: The cavity size was normal. Wall thickness was normal. Systolic function was normal. - Tricuspid valve: There was trivial regurgitation. - Pulmonary arteries: Systolic pressure was within the normal range. PA peak pressure: 27 mm Hg (S).    ASSESSMENT & PLAN:   CAD: stable. continue ASA/Brilitna, statin and BB. He may be able to come off Brilinta 1 year after stent placement. Will ask Dr. Angelena Form.   HLD: LDL 63. continue statin.   HTN: BP well controlled today.   Tobacco abuse: he has totally quit.    Medication Adjustments/Labs and Tests Ordered: Current medicines are reviewed at  length with the patient today.  Concerns regarding medicines are outlined above.  Medication changes, Labs and Tests ordered today are listed in the Patient Instructions below. Patient Instructions  Medication Instructions:  Your physician recommends that you continue on your current medications as directed. Please refer to the Current Medication list given to you today.   Labwork: None ordered  Testing/Procedures: None ordered  Follow-Up: Your physician wants you to follow-up in: [place] DR. Angelena Form  You will receive a reminder letter in the mail two months in advance. If you don't receive a letter, please call our office to schedule the follow-up appointment.    Any Other Special Instructions Will Be Listed Below (If Applicable).     If you need a refill on your cardiac medications before your next appointment, please call your pharmacy.      Signed, Angelena Form, PA-C  02/13/2017 3:41 PM    [facility] [place], [place], [place]  [address] Phone: [phone]; Fax: [phone]

## 2017-02-13 ENCOUNTER — Ambulatory Visit (INDEPENDENT_AMBULATORY_CARE_PROVIDER_SITE_OTHER): Payer: 59 | Admitting: Physician Assistant

## 2017-02-13 ENCOUNTER — Encounter: Payer: Self-pay | Admitting: Physician Assistant

## 2017-02-13 VITALS — BP 124/70 | HR 70 | Ht 75.0 in | Wt 275.1 lb

## 2017-02-13 DIAGNOSIS — Z720 Tobacco use: Secondary | ICD-10-CM

## 2017-02-13 DIAGNOSIS — I10 Essential (primary) hypertension: Secondary | ICD-10-CM | POA: Diagnosis not present

## 2017-02-13 DIAGNOSIS — E7800 Pure hypercholesterolemia, unspecified: Secondary | ICD-10-CM | POA: Diagnosis not present

## 2017-02-13 DIAGNOSIS — I2510 Atherosclerotic heart disease of native coronary artery without angina pectoris: Secondary | ICD-10-CM | POA: Diagnosis not present

## 2017-02-13 NOTE — Patient Instructions (Addendum)
Medication Instructions:  Your physician recommends that you continue on your current medications as directed. Please refer to the Current Medication list given to you today.   Labwork: None ordered  Testing/Procedures: None ordered  Follow-Up: Your physician wants you to follow-up in: 6 MONTHS WITH DR. MCALHANY   You will receive a reminder letter in the mail two months in advance. If you don't receive a letter, please call our office to schedule the follow-up appointment.   Any Other Special Instructions Will Be Listed Below (If Applicable).     If you need a refill on your cardiac medications before your next appointment, please call your pharmacy.   

## 2017-02-14 NOTE — Progress Notes (Signed)
Discussed with Dr. Angelena Form. He can stop Brilinta in 04/2017 after 1 year of DAPT.

## 2017-02-18 ENCOUNTER — Telehealth: Payer: Self-pay | Admitting: Cardiovascular Disease

## 2017-02-18 NOTE — Telephone Encounter (Signed)
Pt saw K. Grandville Silos, [place] on 02/13/17.  Pt had previously scheduled appt with Dr. Angelena Form on 02/24/17.  I reviewed with K. Grandville Silos, [place] and [place] to follow up in 6 months as planned unless pt wants to keep 02/24/17 appt.  I spoke with pt who reports he is feeling well and does not think he needs 02/24/17 appt.  I told pt I would cancel this appt and we would plan on 6 month follow up with Dr. Angelena Form

## 2017-02-18 NOTE — Telephone Encounter (Signed)
New Message  Pt voiced wanting to speak with Fraser Din and for her to give him a call.  Please f/u

## 2017-02-24 ENCOUNTER — Ambulatory Visit: Payer: 59 | Admitting: Cardiovascular Disease

## 2017-07-16 ENCOUNTER — Other Ambulatory Visit: Payer: Self-pay | Admitting: Nurse Practitioner

## 2017-07-16 DIAGNOSIS — I2510 Atherosclerotic heart disease of native coronary artery without angina pectoris: Secondary | ICD-10-CM

## 2017-07-17 NOTE — Telephone Encounter (Signed)
Last seen 11/22/16  MMM

## 2017-07-17 NOTE — Telephone Encounter (Signed)
Last refill without being seen 

## 2017-08-19 ENCOUNTER — Other Ambulatory Visit: Payer: Self-pay | Admitting: Nurse Practitioner

## 2017-08-19 DIAGNOSIS — I2510 Atherosclerotic heart disease of native coronary artery without angina pectoris: Secondary | ICD-10-CM

## 2017-08-30 ENCOUNTER — Ambulatory Visit (INDEPENDENT_AMBULATORY_CARE_PROVIDER_SITE_OTHER): Payer: 59

## 2017-08-30 DIAGNOSIS — Z23 Encounter for immunization: Secondary | ICD-10-CM | POA: Diagnosis not present

## 2017-09-08 ENCOUNTER — Other Ambulatory Visit: Payer: Self-pay | Admitting: Nurse Practitioner

## 2017-09-09 NOTE — Telephone Encounter (Signed)
Last seen 11/22/16  MMM

## 2017-09-19 IMAGING — CT CT RENAL STONE PROTOCOL
2 of 4 series · 16 of 46 positions shown, 18 images · non-contrast
Comparison: None.

CLINICAL DATA: Sudden onset right lower abdominal pain radiating to
the back.

EXAM:
CT ABDOMEN AND PELVIS WITHOUT CONTRAST
TECHNIQUE: Multidetector CT imaging of the abdomen and pelvis was performed
following the standard protocol without IV contrast.

[Series 2: renal stone 5mm · axial · 0.76mm/px · z∈[+865,+1360]mm · 13 of 109 slices shown, 15 images]
[im 5/109  soft-tissue]
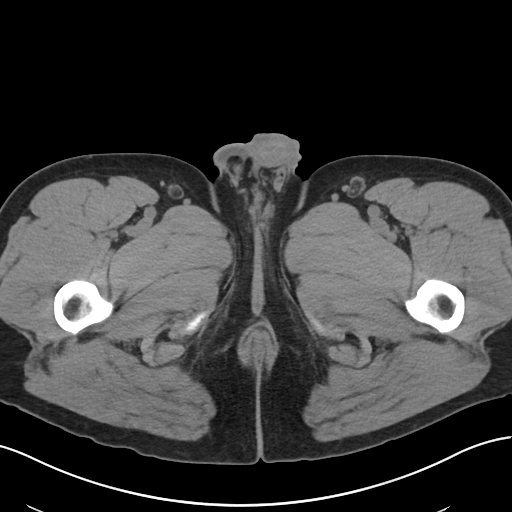
[im 5/109  bone]
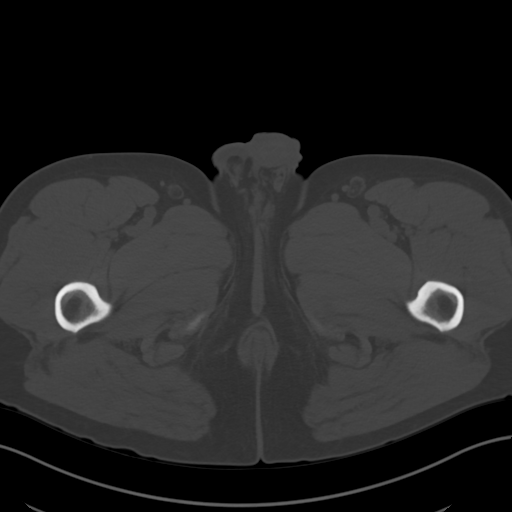
[im 14/109  soft-tissue]
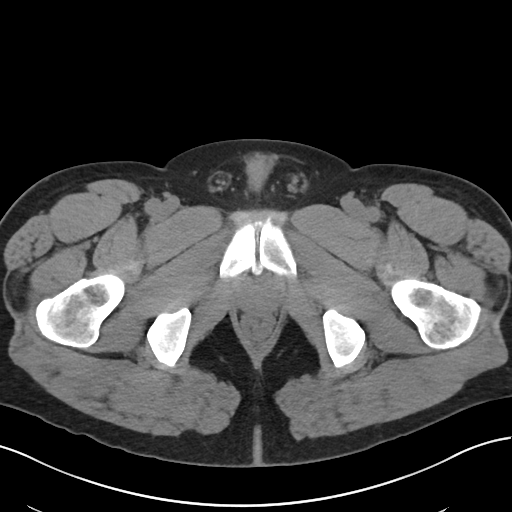
[im 23/109  soft-tissue]
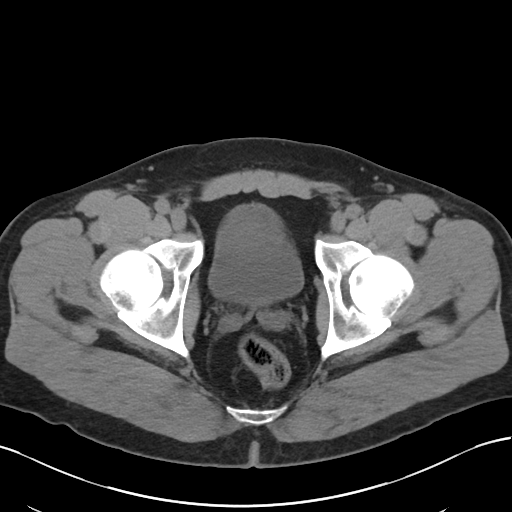
[im 32/109  soft-tissue]
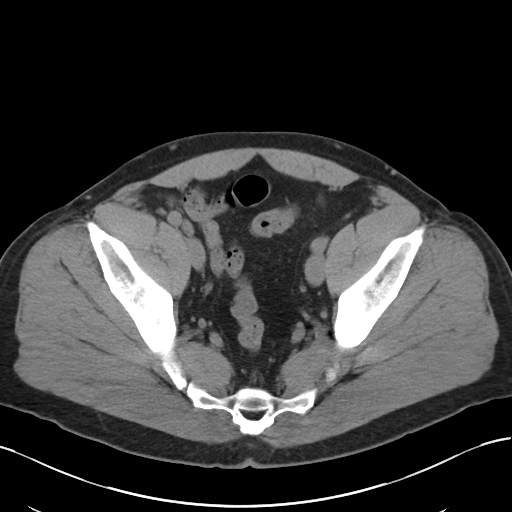
[im 37/109  soft-tissue]
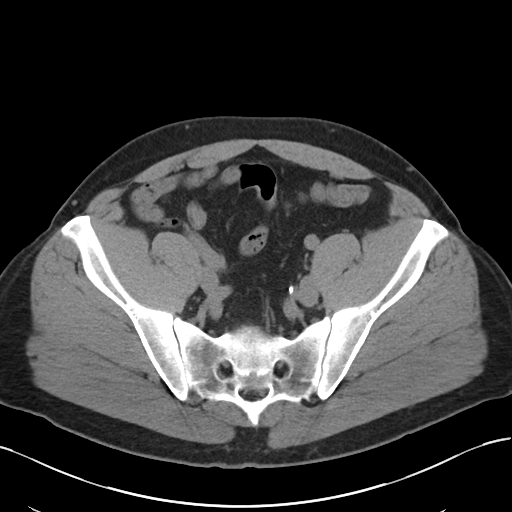
[im 46/109  soft-tissue]
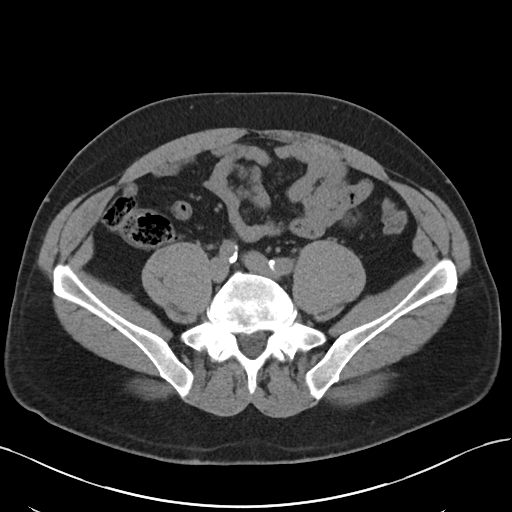
[im 55/109  soft-tissue]
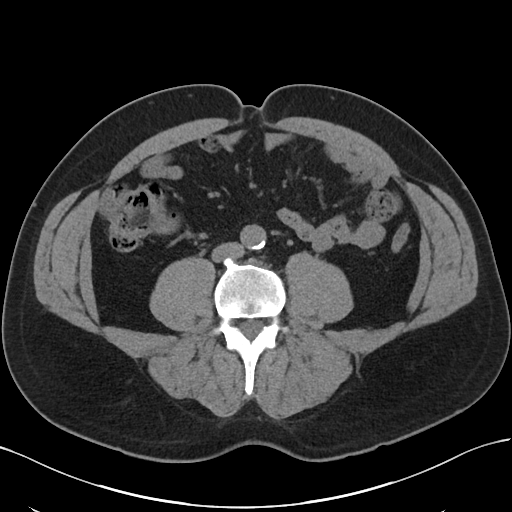
[im 64/109  soft-tissue]
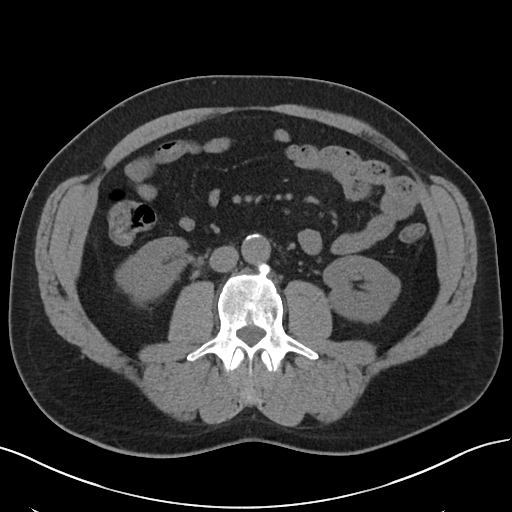
[im 73/109  soft-tissue]
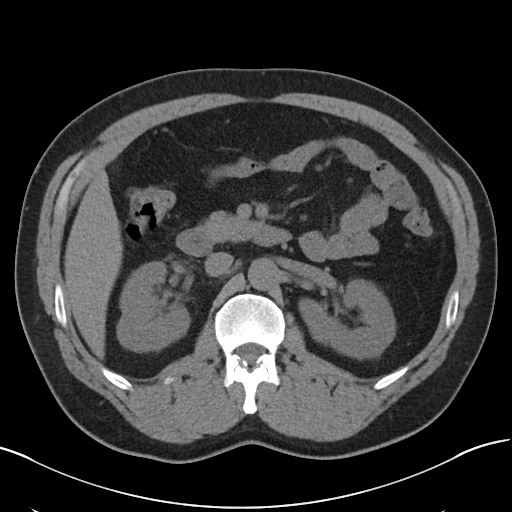
[im 73/109  bone]
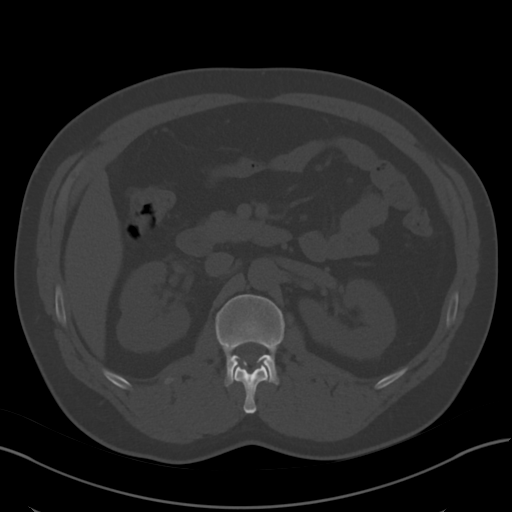
[im 77/109  soft-tissue]
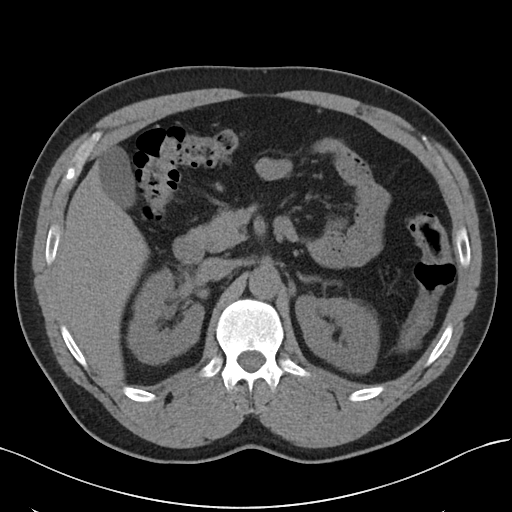
[im 86/109  soft-tissue]
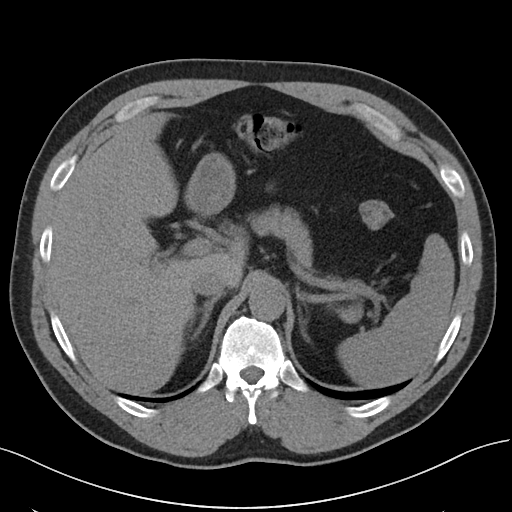
[im 95/109  soft-tissue]
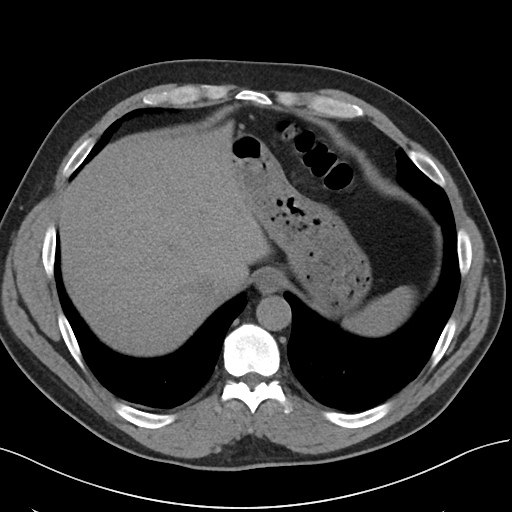
[im 104/109  soft-tissue]
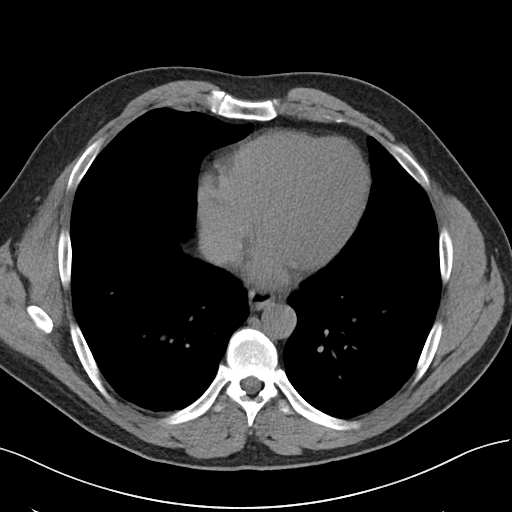

[Series 4: renal stone 3.0 cor · coronal · 0.76mm/px · 3 of 101 slices shown]
[im 34/101  soft-tissue]
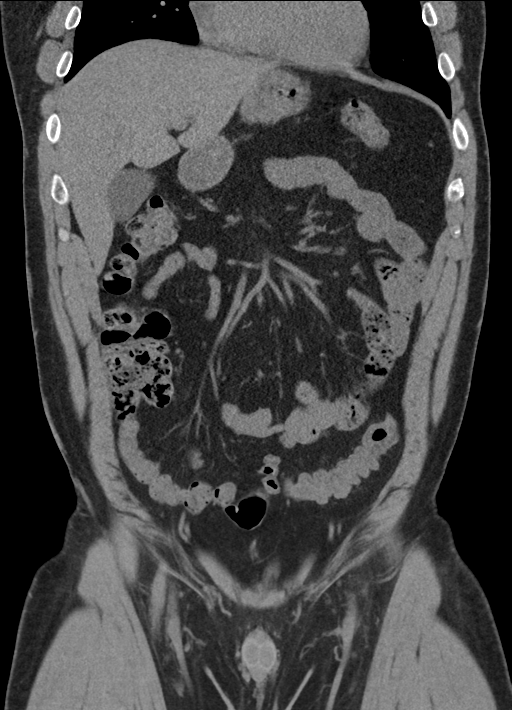
[im 45/101  soft-tissue]
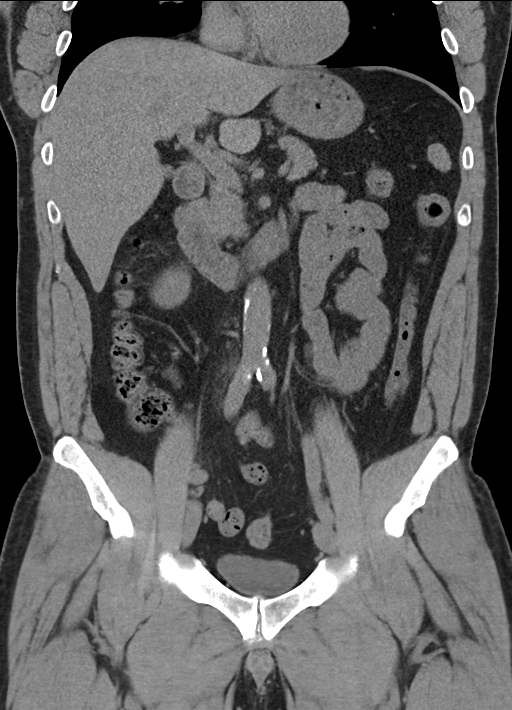
[im 56/101  soft-tissue]
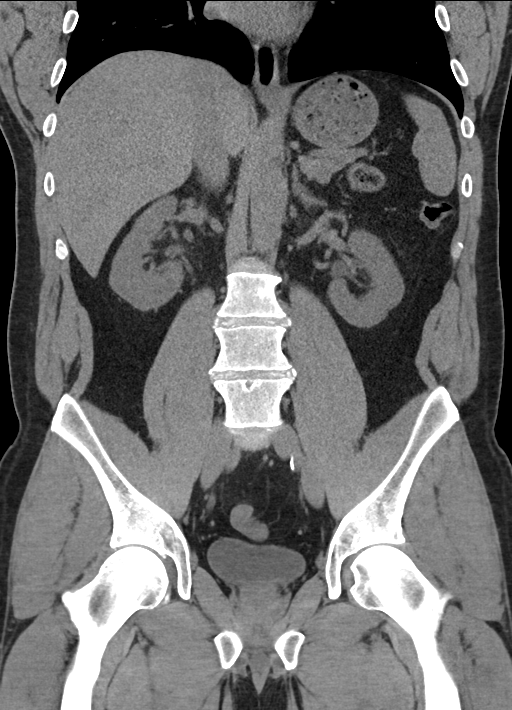

[16 of 46 positions shown; findings below may reference images not displayed]

FINDINGS: There is moderate hydronephrosis and ureterectasis on the right with
perinephric and periureteral stranding. There is a 2 mm calculus
within the urinary bladder lumen. This likely represents recent
passage of right ureteral calculus. No calculi are currently present
in the ureters or collecting systems.

There are unremarkable unenhanced appearances of the liver,
gallbladder, pancreas, spleen and adrenals. The abdominal aorta is
normal in caliber. There is mild atherosclerotic calcification.
There is no adenopathy in the abdomen or pelvis. There are normal
appearances of the stomach, small bowel and colon. The appendix is
normal. There is no significant abnormality in the lower chest.
There is no significant musculoskeletal lesion. Lower lumbar
degenerative disc changes are present. A tiny fat containing
umbilical hernia is incidentally noted.
IMPRESSION: Probable recent passage of a 2 mm calculus from the right ureter
into the urinary bladder. Moderate residual hydronephrosis and
ureterectasis. No ureteral calculus at this time.

## 2017-09-22 NOTE — Telephone Encounter (Signed)
error 

## 2017-10-15 ENCOUNTER — Other Ambulatory Visit: Payer: Self-pay | Admitting: Nurse Practitioner

## 2017-10-15 NOTE — Telephone Encounter (Signed)
Next OV 11/24/17

## 2017-10-27 ENCOUNTER — Encounter: Payer: Self-pay | Admitting: Physician Assistant

## 2017-10-27 ENCOUNTER — Ambulatory Visit: Payer: 59 | Admitting: Physician Assistant

## 2017-10-27 VITALS — BP 120/76 | HR 65 | Ht 74.0 in | Wt 281.8 lb

## 2017-10-27 DIAGNOSIS — Z720 Tobacco use: Secondary | ICD-10-CM

## 2017-10-27 DIAGNOSIS — E7800 Pure hypercholesterolemia, unspecified: Secondary | ICD-10-CM | POA: Diagnosis not present

## 2017-10-27 DIAGNOSIS — I2510 Atherosclerotic heart disease of native coronary artery without angina pectoris: Secondary | ICD-10-CM | POA: Diagnosis not present

## 2017-10-27 DIAGNOSIS — I10 Essential (primary) hypertension: Secondary | ICD-10-CM

## 2017-10-27 NOTE — Progress Notes (Signed)
Cardiology Office Note    Date:  10/27/2017   ID:  Cameron [name], DOB 1966/05/11, MRN [id number]  PCP:  Chevis Pretty, FNP  Cardiologist: [name], MD  Chief Complaint  Patient presents with  . Follow-up    History of Present Illness:  Cameron [name] is a 52 y.o. male with history of CAD status post NSTEMI treated with DES to the LAD and RI 04/2016.  Echo with normal LV systolic function no valvular heart disease, also has hypertension, HLD and tobacco abuse.  Last saw Nell Range, PA-C 01/2017 complaining of sharp right pectoral pain that lasts seconds.  He was exercising regularly without chest pain.  He was trying to get Workmen's Compensation since his MI occurred in the setting of combat training at work.  Patient comes in today for yearly follow-up.  He works out doing cross fit, lifting and the elliptical regularly.  He also walks daily.  He complains about a 20 pound weight gain and knows he needs to lose weight.  He does not smoke but he dips tobacco.  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  He is going to retire in June.  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  He denies any chest pain, palpitations, dyspnea, dyspnea on exertion, dizziness or presyncope.  He has a occasional muscle aches but not regularly.    Past Medical History:  Diagnosis Date  . Aseptic meningitis    a. 06/2003.  Marland Kitchen CAD (coronary artery disease)    a. NSTEMI: [facility] on 05/08/16 which showed severe double vessel CAD with prox-mid LAD 80% thrombotic stenosis s/p PCI/DES, oRI 95% sten s/p PCI/DES.  Marland Kitchen HLD (hyperlipidemia)   . HTN (hypertension)   . Osteoarthritis    a. s/p L Total Knee Arthroplasty.  . Tobacco abuse    a. occasional cigarette - approx 1 pack/month.    Past Surgical History:  Procedure Laterality Date  . CARDIAC CATHETERIZATION N/A 05/08/2016   Procedure: Left Heart Cath and Coronary  Angiography;  Surgeon: Nelva Bush, MD;  Location: [place];  Service: Cardiovascular;  Laterality: N/A;  . CARDIAC CATHETERIZATION N/A 05/08/2016   Procedure: Coronary Stent Intervention;  Surgeon: Nelva Bush, MD;  Location: [place] [facility];  Service: Cardiovascular;  Laterality: N/A;  Mid LAD Ostial/Prox Ramus  . JOINT REPLACEMENT Left 11/12   knee  . KNEE SURGERY      Current Medications: Current Meds  Medication Sig  . aspirin 81 MG tablet Take 81 mg by mouth daily.  Marland Kitchen atorvastatin (LIPITOR) 80 MG tablet TAKE 1 TABLET DAILY AT 6PM  . BRILINTA 90 MG TABS tablet TAKE ONE TABLET BY MOUTH TWICE DAILY  . cephALEXin (KEFLEX) 500 MG capsule Take 1 capsule by mouth 4 (four) times daily.  . metoprolol tartrate (LOPRESSOR) 25 MG tablet TAKE ONE TABLET BY MOUTH TWICE DAILY  . nitroGLYCERIN (NITROSTAT) 0.4 MG SL tablet Place 1 tablet (0.4 mg total) under the tongue every 5 (five) minutes x 3 doses as needed for chest pain.     Allergies:   Patient has no known allergies.   Social History   Socioeconomic History  . Marital status: Married    Spouse name: None  . Number of children: None  . Years of education: None  . Highest education level: None  Social Needs  . Financial resource strain: None  . Food insecurity -  worry: None  . Food insecurity - inability: None  . Transportation needs - medical: None  . Transportation needs - non-medical: None  Occupational History  . Occupation: Chemical engineer: [employer]  Tobacco Use  . Smoking status: Former Smoker    Last attempt to quit: 01/27/2001    Years since quitting: 16.7  . Smokeless tobacco: Current User    Types: Snuff  . Tobacco comment: smokes about 1 pack of cigarettes/month.  Substance and Sexual Activity  . Alcohol use: Yes    Comment: occasional.  . Drug use: No  . Sexual activity: Yes  Other Topics Concern  . None  Social History Narrative   Lives in [place] with wife and children.   Regularly exercises.     Family History:  The patient's family history includes Cancer in his mother; Heart attack in his father; Stroke in his father; Thyroid disease in his mother.   ROS:   Please see the history of present illness.    Review of Systems  Constitution: Negative.  HENT: Negative.   Cardiovascular: Negative.   Respiratory: Negative.   Endocrine: Negative.   Hematologic/Lymphatic: Bruises/bleeds easily.  Musculoskeletal: Positive for myalgias.  Gastrointestinal: Negative.   Genitourinary: Negative.   Neurological: Negative.    All other systems reviewed and are negative.   PHYSICAL EXAM:   VS:  BP 120/76   Pulse 65   Ht 6\' 2"  (1.88 m)   Wt 281 lb 12.8 oz (127.8 kg)   SpO2 96%   BMI 36.18 kg/m   Physical Exam  GEN: Well nourished, well developed, in no acute distress  Neck: no JVD, carotid bruits, or masses Cardiac:RRR; no murmurs, rubs, or gallops  Respiratory:  clear to auscultation bilaterally, normal work of breathing GI: soft, nontender, nondistended, + BS Ext: without cyanosis, clubbing, or edema, Good distal pulses bilaterally Neuro:  Alert and Oriented x 3 Psych: euthymic mood, full affect  Wt Readings from Last 3 Encounters:  10/27/17 281 lb 12.8 oz (127.8 kg)  02/13/17 275 lb 1.9 oz (124.8 kg)  11/22/16 279 lb (126.6 kg)      Studies/Labs Reviewed:   EKG:  EKG is  ordered today.  The ekg ordered today demonstrates sinus bradycardia at 57 bpm, no acute change  Recent Labs: 11/22/2016: ALT 46; BUN 13; Creatinine, Ser 1.03; Hemoglobin 15.8; Platelets 247; Potassium 4.6; Sodium 140   Lipid Panel    Component Value Date/Time   CHOL 120 11/22/2016 0956   TRIG 91 11/22/2016 0956   TRIG 121 11/01/2013 1019   HDL 39 (L) 11/22/2016 0956   HDL 41 11/01/2013 1019   CHOLHDL 3.1 11/22/2016 0956   CHOLHDL 4.9 05/09/2016 0039   VLDL 71 (H) 05/09/2016 0039   LDLCALC 63 11/22/2016 0956   LDLCALC 105 (H) 11/01/2013 1019    Additional studies/  records that were reviewed today include:    Procedures 05/08/16 Coronary Stent Intervention  Left Heart Cath and Coronary Angiography  Conclusion     Prox LAD to Mid LAD lesion, 80 %stenosed.  Post intervention, there is a 0% residual stenosis.  A STENT XIENCE ALPINE RX [id number] drug eluting stent was successfully placed.  Ost Ramus to Ramus lesion, 95 %stenosed.  Post intervention, there is a 0% residual stenosis.  A STENT XIENCE ALPINE RX [id number] drug eluting stent was successfully placed.  LV end diastolic pressure is mildly elevated.   1. Severe double vessel CAD 2. NSTEMI/Unstable angina 3. Severe thrombotic stenosis  mid LAD 4. Severe stenosis proximal Ramus Intermediate branch 5. Successful PTCA/DES x 1 mid LAD 6. Successful PTCA/DES x 1 proximal Ramus intermediate   Recommendations: Will continue DAPT with ASA and Brilinta. Will start high intensity statin. Echo in am. Will start beta blocker. Continue tirofiban infusion for 6 hours post-cath.        2D ECHO: 05/09/2016 LV EF: 60% -   65% Study Conclusions - Left ventricle: The cavity size was normal. Systolic function was   normal. The estimated ejection fraction was in the range of 60%   to 65%. Wall motion was normal; there were no regional wall   motion abnormalities. Left ventricular diastolic function   parameters were normal. Doppler parameters are consistent with   indeterminate ventricular filling pressure. - Aortic valve: Transvalvular velocity was within the normal range.   There was no stenosis. There was no regurgitation. - Mitral valve: Transvalvular velocity was within the normal range.   There was no evidence for stenosis. There was trivial   regurgitation. - Right ventricle: The cavity size was normal. Wall thickness was   normal. Systolic function was normal. - Tricuspid valve: There was trivial regurgitation. - Pulmonary arteries: Systolic pressure was within the normal   range. PA peak  pressure: 27 mm Hg (S).     ASSESSMENT:    1. Coronary artery disease involving native coronary artery of native heart without angina pectoris   2. Essential hypertension   3. Pure hypercholesterolemia   4. Tobacco abuse      PLAN:  In order of problems listed above:  CAD status post NSTEMI treated with DES to the LAD and RI 04/2016 with normal LVEF on echo.  Patient without angina.  He asked about how long to stay on Brilinta.  Discussed with Dr. Angelena Form who recommends he continue this long-term.  Patient has no side effects from it and no trouble affording it.  Follow-up with Dr. Angelena Form in 1 year.  Essential hypertension blood pressure well controlled  Pure hypercholesterolemia LDL at goal 63's 11/2016.  Will need repeat labs in March to be done by his primary care  Tobacco abuse patient quit smoking but unfortunately is dipping tobacco.  He says he cannot stop now with his stressful job but hopes he can cut back once he retires in June.    Medication Adjustments/Labs and Tests Ordered: Current medicines are reviewed at length with the patient today.  Concerns regarding medicines are outlined above.  Medication changes, Labs and Tests ordered today are listed in the Patient Instructions below. Patient Instructions  Your physician recommends that you continue on your current medications as directed. Please refer to the Current Medication list given to you today.   Your physician recommends that you schedule a follow-up appointment in:  YEAR WITH DR Orvan Falconer, PA-C  10/27/2017 10:46 AM    [address], [place], [place]  [address] Phone: [phone]; Fax: [phone]

## 2017-10-27 NOTE — Patient Instructions (Addendum)
Your physician recommends that you continue on your current medications as directed. Please refer to the Current Medication list given to you today.   Your physician recommends that you schedule a follow-up appointment in:  [place]

## 2017-11-24 ENCOUNTER — Encounter: Payer: Self-pay | Admitting: Nurse Practitioner

## 2017-11-24 ENCOUNTER — Ambulatory Visit (INDEPENDENT_AMBULATORY_CARE_PROVIDER_SITE_OTHER): Payer: 59 | Admitting: Nurse Practitioner

## 2017-11-24 VITALS — BP 124/78 | HR 69 | Temp 97.0°F | Ht 74.0 in | Wt 283.0 lb

## 2017-11-24 DIAGNOSIS — I10 Essential (primary) hypertension: Secondary | ICD-10-CM | POA: Diagnosis not present

## 2017-11-24 DIAGNOSIS — I2510 Atherosclerotic heart disease of native coronary artery without angina pectoris: Secondary | ICD-10-CM

## 2017-11-24 DIAGNOSIS — Z0000 Encounter for general adult medical examination without abnormal findings: Secondary | ICD-10-CM

## 2017-11-24 DIAGNOSIS — Z720 Tobacco use: Secondary | ICD-10-CM

## 2017-11-24 DIAGNOSIS — I214 Non-ST elevation (NSTEMI) myocardial infarction: Secondary | ICD-10-CM

## 2017-11-24 DIAGNOSIS — E7800 Pure hypercholesterolemia, unspecified: Secondary | ICD-10-CM | POA: Diagnosis not present

## 2017-11-24 DIAGNOSIS — Z1211 Encounter for screening for malignant neoplasm of colon: Secondary | ICD-10-CM

## 2017-11-24 MED ORDER — METOPROLOL TARTRATE 25 MG PO TABS: 25.0000 mg | ORAL_TABLET | Freq: Two times a day (BID) | ORAL | 1 refills | Status: DC

## 2017-11-24 MED ORDER — TICAGRELOR 90 MG PO TABS: 90.0000 mg | ORAL_TABLET | Freq: Two times a day (BID) | ORAL | 1 refills | Status: DC

## 2017-11-24 MED ORDER — ATORVASTATIN CALCIUM 80 MG PO TABS: ORAL_TABLET | ORAL | 1 refills | Status: DC

## 2017-11-24 NOTE — Progress Notes (Addendum)
Subjective:    Patient ID: MD SMOLA, male    DOB: 1966/04/14, 52 y.o.   MRN: [id number]  HPI  Cameron [name] is here today for follow up of chronic medical problem.  Outpatient Encounter Medications as of 11/24/2017  Medication Sig  . aspirin 81 MG tablet Take 81 mg by mouth daily.  Marland Kitchen atorvastatin (LIPITOR) 80 MG tablet TAKE 1 TABLET DAILY AT 6PM  . BRILINTA 90 MG TABS tablet TAKE ONE TABLET BY MOUTH TWICE DAILY  . metoprolol tartrate (LOPRESSOR) 25 MG tablet TAKE ONE TABLET BY MOUTH TWICE DAILY  . nitroGLYCERIN (NITROSTAT) 0.4 MG SL tablet Place 1 tablet (0.4 mg total) under the tongue every 5 (five) minutes x 3 doses as needed for chest pain.    1. Annual physical exam   2. Coronary artery disease involving native coronary artery of native heart without angina pectoris  Patient had follow up with cardiology on 10/27/17. He has been doing well and no changes were made to treatmnet  3. Essential hypertension  No c/o chest pain, sob or headache. He does not check blood pressure at home. BP Readings from Last 3 Encounters:  11/24/17 124/78  10/27/17 120/76  02/13/17 124/70     4. NSTEMI (non-ST elevated myocardial infarction) [facility])  Had this in 2017- had immediate cath and stent placement and has done well. He is on brilinta, and was told ns to remain on this for life.  5. Pure hypercholesterolemia  He does watch his diet  6. Tobacco abuse  He actually dips daily- has no desire to stop currently.    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  HENT: Negative.   Eyes: Negative for pain.  Respiratory: Negative for shortness of breath.   Cardiovascular: Negative for chest pain, palpitations and leg swelling.  Gastrointestinal: Negative for abdominal pain.  Endocrine: Negative for polydipsia.  Genitourinary: Negative.   Skin:  Negative for rash.  Neurological: Negative for dizziness, weakness and headaches.  Hematological: Does not bruise/bleed easily.  Psychiatric/Behavioral: Negative.   All other systems reviewed and are negative.      Objective:   Physical Exam  Constitutional: He is oriented to person, place, and time. He appears well-developed and well-nourished.  HENT:  Head: Normocephalic.  Right Ear: External ear normal.  Left Ear: External ear normal.  Nose: Nose normal.  Mouth/Throat: Oropharynx is clear and moist.  Eyes: EOM are normal. Pupils are equal, round, and reactive to light.  Neck: Normal range of motion. Neck supple. No JVD present. No thyromegaly present.  Cardiovascular: Normal rate, regular rhythm, normal heart sounds and intact distal pulses. Exam reveals no gallop and no friction rub.  No murmur heard. Pulmonary/Chest: Effort normal and breath sounds normal. No respiratory distress. He has no wheezes. He has no rales. He exhibits no tenderness.  Abdominal: Soft. Bowel sounds are normal. He exhibits no mass. There is no tenderness.  Genitourinary: Prostate normal and penis normal.  Musculoskeletal: Normal range of motion. He exhibits no edema.  Lymphadenopathy:    He has no cervical adenopathy.  Neurological: He is alert and oriented to person, place, and time. No cranial nerve deficit.  Skin: Skin is warm and dry.  Psychiatric: He has a normal mood and affect. His behavior is normal. Judgment and thought content normal.   BP 124/78   Pulse  69   Temp (!) 97 F (36.1 C) (Oral)   Ht 6' 2"  (1.88 m)   Wt 283 lb (128.4 kg)   BMI 36.34 kg/m       Assessment & Plan:  1. Annual physical exam - CBC with Differential/Platelet  2. Coronary artery disease involving native coronary artery of native heart without angina pectoris Keep yearly follow up with cardiology - metoprolol tartrate (LOPRESSOR) 25 MG tablet; Take 1 tablet (25 mg total) by mouth 2 (two) times daily.  Dispense:  180 tablet; Refill: 1  3. Essential hypertension Low sodium diet - CMP14+EGFR  4. NSTEMI (non-ST elevated myocardial infarction) (HCC) - ticagrelor (BRILINTA) 90 MG TABS tablet; Take 1 tablet (90 mg total) by mouth 2 (two) times daily.  Dispense: 180 tablet; Refill: 1  5. Pure hypercholesterolemia Low fat diet - Lipid panel - atorvastatin (LIPITOR) 80 MG tablet; TAKE 1 TABLET DAILY AT 6PM  Dispense: 90 tablet; Refill: 1  6. Tobacco abuse Encouraged to stop dipping   Referral made for colonoscopy Labs pending Health maintenance reviewed Diet and exercise encouraged Continue all meds Follow up  In 6 months   [place], FNP

## 2017-11-24 NOTE — Addendum Note (Signed)
Addended by: Chevis Pretty on: 11/24/2017 11:45 AM   Modules accepted: Orders

## 2017-11-24 NOTE — Patient Instructions (Signed)
What You Need to Know About Smokeless Tobacco Use Tobacco use is one of the leading causes of cancer and other chronic health problems. Smokeless tobacco is tobacco that is put directly into the mouth instead of being smoked. It may also be called chewing tobacco or snuff. Smokeless tobacco is made from the leaves of tobacco plants and it comes in several forms:  Loose, dry leaves, plugs, or twists.  Moist pouches.  Dissolving lozenges or strips.  Chewing, sucking, or holding the tobacco in your mouth causes your mouth to make more saliva. The saliva mixes with the tobacco to make "tobacco juice" that is swallowed or spit out. How can smokeless tobacco affect me? Using smokeless tobacco:  Increases your risk of developing cancer. Smokeless tobacco contains at least 28 different types of cancer-causing chemicals (carcinogens).  Increases your chances of developing other long-term health problems, including high blood pressure, heart disease, stroke, and dental problems.  Can make you become addicted. Nicotine is one of the chemicals in tobacco. When you chew tobacco, you absorb nicotine from the tobacco juice. This can make you feel more alert than usual.  Can cause problems with pregnancy. Pregnant women who use smokeless tobacco are more likely to miscarry or deliver a baby too early (premature delivery).  Can affect the appearance and health of your mouth. Using smokeless tobacco may cause bad breath, yellow-brown teeth, mouth sores, cracking and bleeding lips, gum recession, and lesions on the soft tissues of your mouth (leukoplakia).  What are the benefits of not using smokeless tobacco? The benefits of not using smokeless tobacco include:  A healthy mind because: ? You avoid addiction.  A healthy body because: ? You avoid dental problems. ? You promote healthy pregnancy. ? You avoid long-term health problems.  A healthy wallet because: ? You avoid costs of buying  tobacco. ? You avoid health care costs in the future.  A healthy family because: ? You avoid accidental poisoning of children in your household.  What can happen if I continue to use smokeless tobacco? If you continue to use smokeless tobacco, you will increase your risk for developing certain cancers. These include:  Tongue.  Lips, mouth, and gums.  Throat (esophagus) and voice box (larynx).  Stomach.  Pancreas.  Bladder.  Colon.  Long-term use of smokeless tobacco can also lead to:  High blood pressure, heart disease, and stroke.  Gum disease, gum recession, and bone loss around the teeth.  Tooth decay.  How do I quit using smokeless tobacco? Quitting the use of smokeless tobacco can be hard, but it can be done. Follow these steps:  Pick a date to quit. Set a date within the next two weeks. This gives you time to prepare.  Write down the reasons why you are quitting. Keep this list in places where you will see it often, such as on your bathroom mirror or in your car or wallet.  Identify the people, places, things, and activities that make you want to use tobacco (triggers) and avoid them.  Get rid of any tobacco you have and remove any tobacco smells. To do this: ? Throw away all containers of tobacco at home, at work, and in your car. ? Throw away any other items that you use regularly when you chew tobacco. ? Clean your car and make sure to remove all tobacco-related items. ? Clean your home, including curtains and carpets.  Tell your family, friends, and coworkers that you are quitting. This can make quitting   easier.  Ask your health care provider for help quitting smokeless tobacco. This may involve treatment. Find out what treatment options are covered by your health insurance.  Keep track of how many days have passed since you quit. Remembering how long and hard you have worked to quit can help you avoid using tobacco again.  Where can I get support? Ask  your health care provider if there is a local support group for quitting smokeless tobacco. Where can I get more information? You can learn more about the risks of using smokeless tobacco and the benefits of quitting from these sources:  National Cancer Institute: www.cancer.gov  American Cancer Society: www.cancer.org  When should I seek medical care? Seek medical care if you have:  White or other discolored patches in your mouth.  Difficulty swallowing.  A change in your voice.  Unexplained weight loss.  Stomach pain, nausea, or vomiting.  Summary  Smokeless tobacco contains at least 28 different chemicals that are known to cause cancer (carcinogen).  Nicotine is an addictive chemical in smokeless tobacco.  When you quit using smokeless tobacco, you lower your risk of developing cancer. This information is not intended to replace advice given to you by your health care provider. Make sure you discuss any questions you have with your health care provider. Document Released: 02/11/2011 Document Revised: 05/04/2016 Document Reviewed: 04/21/2015 Elsevier Interactive Patient Education  2018 Elsevier Inc.  

## 2017-11-25 LAB — CMP14+EGFR
A/G RATIO: 1.9 (ref 1.2–2.2)
ALK PHOS: 78 IU/L (ref 39–117)
ALT: 39 IU/L (ref 0–44)
AST: 32 IU/L (ref 0–40)
Albumin: 4.5 g/dL (ref 3.5–5.5)
BILIRUBIN TOTAL: 0.9 mg/dL (ref 0.0–1.2)
BUN / CREAT RATIO: 14 (ref 9–20)
BUN: 15 mg/dL (ref 6–24)
CHLORIDE: 102 mmol/L (ref 96–106)
CO2: 23 mmol/L (ref 20–29)
Calcium: 9.4 mg/dL (ref 8.7–10.2)
Creatinine, Ser: 1.07 mg/dL (ref 0.76–1.27)
GFR calc Af Amer: 91 mL/min/{1.73_m2} (ref >59)
GFR calc non Af Amer: 79 mL/min/{1.73_m2} (ref >59)
GLUCOSE: 95 mg/dL (ref 65–99)
Globulin, Total: 2.4 g/dL (ref 1.5–4.5)
Potassium: 4.4 mmol/L (ref 3.5–5.2)
Sodium: 139 mmol/L (ref 134–144)
Total Protein: 6.9 g/dL (ref 6.0–8.5)

## 2017-11-25 LAB — CBC WITH DIFFERENTIAL/PLATELET
BASOS ABS: 0 10*3/uL (ref 0.0–0.2)
Basos: 1 %
EOS (ABSOLUTE): 0.1 10*3/uL (ref 0.0–0.4)
Eos: 1 %
Hematocrit: 45.3 % (ref 37.5–51.0)
Hemoglobin: 15.6 g/dL (ref 13.0–17.7)
Immature Grans (Abs): 0 10*3/uL (ref 0.0–0.1)
Immature Granulocytes: 0 %
LYMPHS: 33 %
Lymphocytes Absolute: 2.7 10*3/uL (ref 0.7–3.1)
MCH: 31.8 pg (ref 26.6–33.0)
MCHC: 34.4 g/dL (ref 31.5–35.7)
MCV: 92 fL (ref 79–97)
MONOS ABS: 0.5 10*3/uL (ref 0.1–0.9)
Monocytes: 7 %
NEUTROS ABS: 4.8 10*3/uL (ref 1.4–7.0)
Neutrophils: 58 %
Platelets: 247 10*3/uL (ref 150–379)
RBC: 4.9 x10E6/uL (ref 4.14–5.80)
RDW: 13.4 % (ref 12.3–15.4)
WBC: 8.1 10*3/uL (ref 3.4–10.8)

## 2017-11-25 LAB — LIPID PANEL
CHOLESTEROL TOTAL: 121 mg/dL (ref 100–199)
Chol/HDL Ratio: 2.8 ratio (ref 0.0–5.0)
HDL: 44 mg/dL (ref >39)
LDL Calculated: 62 mg/dL (ref 0–99)
Triglycerides: 76 mg/dL (ref 0–149)
VLDL CHOLESTEROL CAL: 15 mg/dL (ref 5–40)

## 2017-11-26 ENCOUNTER — Encounter: Payer: Self-pay | Admitting: Internal Medicine

## 2018-01-27 ENCOUNTER — Ambulatory Visit: Payer: 59 | Admitting: Nurse Practitioner

## 2018-01-27 ENCOUNTER — Telehealth: Payer: Self-pay | Admitting: Nurse Practitioner

## 2018-01-27 ENCOUNTER — Encounter: Payer: Self-pay | Admitting: Nurse Practitioner

## 2018-01-27 NOTE — Telephone Encounter (Signed)
PATIENT WAS A NO SHOW AND LETTER SENT  °

## 2018-01-27 NOTE — Telephone Encounter (Signed)
Noted  

## 2018-02-10 ENCOUNTER — Encounter: Payer: Self-pay | Admitting: Nurse Practitioner

## 2018-02-13 ENCOUNTER — Other Ambulatory Visit: Payer: Self-pay | Admitting: Nurse Practitioner

## 2018-02-13 MED ORDER — SILDENAFIL CITRATE 50 MG PO TABS: 50.0000 mg | ORAL_TABLET | ORAL | 1 refills | Status: DC | PRN

## 2018-02-13 NOTE — Progress Notes (Unsigned)
viagra

## 2018-02-17 ENCOUNTER — Ambulatory Visit: Payer: 59 | Admitting: Physician Assistant

## 2018-02-18 ENCOUNTER — Ambulatory Visit: Payer: 59 | Admitting: Family Medicine

## 2018-02-18 ENCOUNTER — Encounter: Payer: Self-pay | Admitting: Family Medicine

## 2018-02-18 VITALS — BP 107/72 | HR 68 | Temp 96.9°F | Ht 74.0 in | Wt 274.0 lb

## 2018-02-18 DIAGNOSIS — E860 Dehydration: Secondary | ICD-10-CM | POA: Diagnosis not present

## 2018-02-18 DIAGNOSIS — K529 Noninfective gastroenteritis and colitis, unspecified: Secondary | ICD-10-CM

## 2018-02-18 MED ORDER — RANITIDINE HCL 150 MG PO TABS: 150.0000 mg | ORAL_TABLET | Freq: Two times a day (BID) | ORAL | 1 refills | Status: DC

## 2018-02-18 MED ORDER — ONDANSETRON 4 MG PO TBDP: 4.0000 mg | ORAL_TABLET | Freq: Three times a day (TID) | ORAL | 0 refills | Status: DC | PRN

## 2018-02-18 NOTE — Progress Notes (Signed)
BP 107/72   Pulse 68   Temp (!) 96.9 F (36.1 C) (Oral)   Ht 6\' 2"  (1.88 m)   Wt 274 lb (124.3 kg)   BMI 35.18 kg/m    Subjective:    Patient ID: Cameron [name], male    DOB: 28-Feb-1966, 52 y.o.   MRN: [id number]  HPI: Cameron [name] is a 52 y.o. male presenting on 02/18/2018 for vomiting and diarrhea (since eating out Friday night. Lightheaded and weak)   HPI Nausea vomiting and diarrhea Patient comes in complaining of nausea and vomiting and diarrhea and feeling gassy and bloated that has been going on for about 4 to 5 days.  He does states improved and the vomiting is done, he only feels intermittently nauseous and he is starting to keep fluids down more.  He does say he feels very weak and feels lightheaded at times.  He denies any fevers or chills.  He did have a couple episodes of blood streaking in his stool yesterday but none today.  He did have one episode of diarrhea so far today but everything else seems to have improved.  He has been trying to eat potatoes and bread and using Sprite and water trying to stay hydrated.  He does admit that he has not urinated this morning yet but he is starting to get on top of his hydration better.  He still feels somewhat gassy and bloated and does not feel like eating as much.  He denies any sick contacts that he knows of but he thinks this started on Friday evening about 2 hours after eating out.  Relevant past medical, surgical, family and social history reviewed and updated as indicated. Interim medical history since our last visit reviewed. Allergies and medications reviewed and updated.  Review of Systems  Constitutional: Negative for chills and fever.  Respiratory: Negative for shortness of breath and wheezing.   Cardiovascular: Negative for chest pain and leg swelling.  Gastrointestinal: Positive for abdominal pain, blood in stool, diarrhea, nausea and vomiting. Negative for constipation.  Musculoskeletal: Negative for back pain and  gait problem.  Skin: Negative for rash.  All other systems reviewed and are negative.   Per HPI unless specifically indicated above   Allergies as of 02/18/2018   No Known Allergies     Medication List        Accurate as of 02/18/18 10:17 AM. Always use your most recent med list.          aspirin 81 MG tablet Take 81 mg by mouth daily.   atorvastatin 80 MG tablet Commonly known as:  LIPITOR TAKE 1 TABLET DAILY AT 6PM   metoprolol tartrate 25 MG tablet Commonly known as:  LOPRESSOR Take 1 tablet (25 mg total) by mouth 2 (two) times daily.   nitroGLYCERIN 0.4 MG SL tablet Commonly known as:  NITROSTAT Place 1 tablet (0.4 mg total) under the tongue every 5 (five) minutes x 3 doses as needed for chest pain.   ondansetron 4 MG disintegrating tablet Commonly known as:  ZOFRAN ODT Take 1 tablet (4 mg total) by mouth every 8 (eight) hours as needed for nausea or vomiting.   ranitidine 150 MG tablet Commonly known as:  ZANTAC Take 1 tablet (150 mg total) by mouth 2 (two) times daily.   sildenafil 50 MG tablet Commonly known as:  VIAGRA Take 1 tablet (50 mg total) by mouth as needed for erectile dysfunction.   ticagrelor 90 MG Tabs tablet Commonly  known as:  BRILINTA Take 1 tablet (90 mg total) by mouth 2 (two) times daily.          Objective:    BP 107/72   Pulse 68   Temp (!) 96.9 F (36.1 C) (Oral)   Ht 6\' 2"  (1.88 m)   Wt 274 lb (124.3 kg)   BMI 35.18 kg/m   Wt Readings from Last 3 Encounters:  02/18/18 274 lb (124.3 kg)  11/24/17 283 lb (128.4 kg)  10/27/17 281 lb 12.8 oz (127.8 kg)    Physical Exam  Constitutional: He is oriented to person, place, and time. He appears well-developed and well-nourished. No distress.  HENT:  Mouth/Throat: Oropharynx is clear and moist.  Eyes: Conjunctivae are normal. No scleral icterus.  Neck: Neck supple. No thyromegaly present.  Cardiovascular: Normal rate, regular rhythm, normal heart sounds and intact distal  pulses.  No murmur heard. Pulmonary/Chest: Effort normal and breath sounds normal. No respiratory distress. He has no wheezes.  Abdominal: Soft. Bowel sounds are normal. He exhibits no distension and no mass. There is no tenderness. There is no rebound and no guarding.  Musculoskeletal: Normal range of motion. He exhibits no edema.  Lymphadenopathy:    He has no cervical adenopathy.  Neurological: He is alert and oriented to person, place, and time. Coordination normal.  Skin: Skin is warm and dry. No rash noted. He is not diaphoretic.  Psychiatric: He has a normal mood and affect. His behavior is normal.  Nursing note and vitals reviewed.     Assessment & Plan:   Problem List Items Addressed This Visit    None    Visit Diagnoses    Gastroenteritis    -  Primary   Relevant Medications   ranitidine (ZANTAC) 150 MG tablet   ondansetron (ZOFRAN ODT) 4 MG disintegrating tablet   Mild dehydration       Relevant Medications   ondansetron (ZOFRAN ODT) 4 MG disintegrating tablet     Patient has mild dehydration but not having nausea or vomiting, recommended for him to use the Zofran and stay hydrated and pound fluids over the next couple days but if worsens we may need to give him some IV fluids.  Follow up plan: Return if symptoms worsen or fail to improve.  Counseling provided for all of the vaccine components No orders of the defined types were placed in this encounter.   Caryl Pina, MD [facility] 02/18/2018, 10:17 AM

## 2018-03-11 ENCOUNTER — Encounter: Payer: Self-pay | Admitting: Nurse Practitioner

## 2018-03-14 ENCOUNTER — Ambulatory Visit: Payer: 59 | Admitting: Physician Assistant

## 2018-03-14 ENCOUNTER — Encounter: Payer: Self-pay | Admitting: Physician Assistant

## 2018-03-14 VITALS — BP 106/73 | HR 58 | Temp 96.7°F | Ht 74.0 in | Wt 274.0 lb

## 2018-03-14 DIAGNOSIS — L570 Actinic keratosis: Secondary | ICD-10-CM | POA: Diagnosis not present

## 2018-03-14 DIAGNOSIS — D1801 Hemangioma of skin and subcutaneous tissue: Secondary | ICD-10-CM

## 2018-03-14 NOTE — Patient Instructions (Signed)
Aqua glycolic

## 2018-03-16 DIAGNOSIS — D1801 Hemangioma of skin and subcutaneous tissue: Secondary | ICD-10-CM | POA: Insufficient documentation

## 2018-03-16 DIAGNOSIS — L570 Actinic keratosis: Secondary | ICD-10-CM

## 2018-03-16 HISTORY — DX: Actinic keratosis: L57.0

## 2018-03-16 NOTE — Progress Notes (Signed)
BP 106/73   Pulse (!) 58   Temp (!) 96.7 F (35.9 C) (Oral)   Ht 6\' 2"  (1.88 m)   Wt 274 lb (124.3 kg)   BMI 35.18 kg/m    Subjective:    Patient ID: Cameron [name], male    DOB: October 02, 1965, 52 y.o.   MRN: [id number]  HPI: Cameron [name] is a 52 y.o. male presenting on 03/14/2018 for place on leg and shoulder  This patient comes in with multiple skin lesions that he is concerned about.  He has a new lesion on his back he feels that there are several of them.  And he can feel that they are raised and they are painful sometimes whenever he brushes by them.  He also has an area on his right lateral leg that is scaly and peels.  He would like to look at having these off.  We will schedule him with his PCP for a removal appointment.  Past Medical History:  Diagnosis Date  . Aseptic meningitis    a. 06/2003.  Marland Kitchen CAD (coronary artery disease)    a. NSTEMI: [facility] on 05/08/16 which showed severe double vessel CAD with prox-mid LAD 80% thrombotic stenosis s/p PCI/DES, oRI 95% sten s/p PCI/DES.  Marland Kitchen HLD (hyperlipidemia)   . HTN (hypertension)   . Osteoarthritis    a. s/p L Total Knee Arthroplasty.  . Tobacco abuse    a. occasional cigarette - approx 1 pack/month.   Relevant past medical, surgical, family and social history reviewed and updated as indicated. Interim medical history since our last visit reviewed. Allergies and medications reviewed and updated. DATA REVIEWED: CHART IN EPIC  Family History reviewed for pertinent findings.  Review of Systems  Constitutional: Negative.  Negative for appetite change and fatigue.  Eyes: Negative for pain and visual disturbance.  Respiratory: Negative.  Negative for cough, chest tightness, shortness of breath and wheezing.   Cardiovascular: Negative.  Negative for chest pain, palpitations and leg swelling.  Gastrointestinal: Negative.  Negative for abdominal pain, diarrhea, nausea and vomiting.  Genitourinary: Negative.   Skin: Positive for  color change and wound. Negative for rash.  Neurological: Negative.  Negative for weakness, numbness and headaches.  Psychiatric/Behavioral: Negative.     Allergies as of 03/14/2018   No Known Allergies     Medication List        Accurate as of 03/14/18 11:59 PM. Always use your most recent med list.          aspirin 81 MG tablet Take 81 mg by mouth daily.   atorvastatin 80 MG tablet Commonly known as:  LIPITOR TAKE 1 TABLET DAILY AT 6PM   metoprolol tartrate 25 MG tablet Commonly known as:  LOPRESSOR Take 1 tablet (25 mg total) by mouth 2 (two) times daily.   nitroGLYCERIN 0.4 MG SL tablet Commonly known as:  NITROSTAT Place 1 tablet (0.4 mg total) under the tongue every 5 (five) minutes x 3 doses as needed for chest pain.   sildenafil 50 MG tablet Commonly known as:  VIAGRA Take 1 tablet (50 mg total) by mouth as needed for erectile dysfunction.   ticagrelor 90 MG Tabs tablet Commonly known as:  BRILINTA Take 1 tablet (90 mg total) by mouth 2 (two) times daily.          Objective:    BP 106/73   Pulse (!) 58   Temp (!) 96.7 F (35.9 C) (Oral)   Ht 6\' 2"  (  1.88 m)   Wt 274 lb (124.3 kg)   BMI 35.18 kg/m   No Known Allergies  Wt Readings from Last 3 Encounters:  03/14/18 274 lb (124.3 kg)  02/18/18 274 lb (124.3 kg)  11/24/17 283 lb (128.4 kg)    Physical Exam  Constitutional: He appears well-developed and well-nourished. No distress.  HENT:  Head: Normocephalic and atraumatic.  Eyes: Pupils are equal, round, and reactive to light. Conjunctivae and EOM are normal.  Cardiovascular: Normal rate, regular rhythm and normal heart sounds.  Pulmonary/Chest: Effort normal and breath sounds normal. No respiratory distress.  Skin: Skin is warm and dry.  On the right upper back just medial to scapula are 3 Cherry that are tender to the touch.  They are still raised and red in color.  right lateral leg with scaling pigmented lesion.  It is rough to the touch.    Psychiatric: He has a normal mood and affect. His behavior is normal.  Nursing note and vitals reviewed.       Assessment & Plan:   1. Hemangioma of skin Appointment for removal  2. Actinic keratosis Appointment for removal   Continue all other maintenance medications as listed above.  Follow up plan: Return for MMM 30 minutes late after noon for lesions removal.  Educational handout given for skin product  Terald Sleeper PA-C [facility] [address]  [place], [place] [address] [phone]   03/16/2018, 8:05 AM

## 2018-03-25 ENCOUNTER — Ambulatory Visit: Payer: 59 | Admitting: Nurse Practitioner

## 2018-03-27 ENCOUNTER — Encounter: Payer: Self-pay | Admitting: Nurse Practitioner

## 2018-03-27 ENCOUNTER — Ambulatory Visit: Payer: 59 | Admitting: Nurse Practitioner

## 2018-03-27 VITALS — BP 107/73 | HR 69 | Temp 97.2°F | Ht 74.0 in | Wt 276.0 lb

## 2018-03-27 DIAGNOSIS — L918 Other hypertrophic disorders of the skin: Secondary | ICD-10-CM | POA: Diagnosis not present

## 2018-03-27 NOTE — Progress Notes (Signed)
   Subjective:    Patient ID: Cameron [name], male    DOB: Nov 07, 1965, 52 y.o.   MRN: [id number]   Chief Complaint: skin lesion  HPI Patient was seen by Arlis Porta, PA on 03/14/18 and she referred her to me for removal.   Review of Systems  Respiratory: Negative.   Cardiovascular: Negative.   Skin:       Skin lesion on right side neck  All other systems reviewed and are negative.      Objective:   Physical Exam  Constitutional: He appears well-developed and well-nourished. No distress.  Cardiovascular: Normal rate.  Pulmonary/Chest: Effort normal.  Neurological: He is alert.  Skin: Skin is warm.  Skin lesions     Procedure:  Betadine   Lidocaine 1% with epi 39ml  Local  Shaved lesions off  Silver nitrate sticks for cauterization       Assessment & Plan:  Cameron [name] in today with chief complaint of No chief complaint on file.   1. Skin tag Watch for signs of infection TO prn  Mary-Margaret Hassell Done, FNP

## 2018-06-02 ENCOUNTER — Encounter: Payer: Self-pay | Admitting: Nurse Practitioner

## 2018-06-02 ENCOUNTER — Ambulatory Visit: Payer: 59 | Admitting: Nurse Practitioner

## 2018-06-02 VITALS — BP 108/72 | HR 66 | Temp 97.0°F | Ht 74.0 in | Wt 279.0 lb

## 2018-06-02 DIAGNOSIS — E7800 Pure hypercholesterolemia, unspecified: Secondary | ICD-10-CM

## 2018-06-02 DIAGNOSIS — I10 Essential (primary) hypertension: Secondary | ICD-10-CM

## 2018-06-02 DIAGNOSIS — I2510 Atherosclerotic heart disease of native coronary artery without angina pectoris: Secondary | ICD-10-CM

## 2018-06-02 DIAGNOSIS — I214 Non-ST elevation (NSTEMI) myocardial infarction: Secondary | ICD-10-CM | POA: Diagnosis not present

## 2018-06-02 DIAGNOSIS — Z6835 Body mass index (BMI) 35.0-35.9, adult: Secondary | ICD-10-CM

## 2018-06-02 DIAGNOSIS — Z720 Tobacco use: Secondary | ICD-10-CM

## 2018-06-02 DIAGNOSIS — Z1159 Encounter for screening for other viral diseases: Secondary | ICD-10-CM

## 2018-06-02 MED ORDER — METOPROLOL TARTRATE 25 MG PO TABS: 25.0000 mg | ORAL_TABLET | Freq: Two times a day (BID) | ORAL | 1 refills | Status: DC

## 2018-06-02 MED ORDER — ATORVASTATIN CALCIUM 80 MG PO TABS: ORAL_TABLET | ORAL | 1 refills | Status: DC

## 2018-06-02 NOTE — Progress Notes (Signed)
Subjective:    Patient ID: Cameron [name], male    DOB: 09-18-1966, 52 y.o.   MRN: [id number]   Chief Complaint: Medical Management of Chronic Issues   HPI:  1. NSTEMI (non-ST elevated myocardial infarction) ([place])  Had 2 years ago. Has done well since stent surgery. Saw cardiologist in February o change to plan of care and he is to follow up in a year.  2. Essential hypertension  No c/o chest pain, sob or headache. Does not check blood pressure at home.  3. Coronary artery disease involving native coronary artery of native heart without angina pectoris He is doing well.  4. Pure hypercholesterolemia  Is on statin. Last ldl was 62.  5. Tobacco abuse  Patient dips daily- has been doing for years. Denies any oral lesions.    Outpatient Encounter Medications as of 06/02/2018  Medication Sig  . aspirin 81 MG tablet Take 81 mg by mouth daily.  Marland Kitchen atorvastatin (LIPITOR) 80 MG tablet TAKE 1 TABLET DAILY AT 6PM  . metoprolol tartrate (LOPRESSOR) 25 MG tablet Take 1 tablet (25 mg total) by mouth 2 (two) times daily.  . nitroGLYCERIN (NITROSTAT) 0.4 MG SL tablet Place 1 tablet (0.4 mg total) under the tongue every 5 (five) minutes x 3 doses as needed for chest pain.  . sildenafil (VIAGRA) 50 MG tablet Take 1 tablet (50 mg total) by mouth as needed for erectile dysfunction.  . ticagrelor (BRILINTA) 90 MG TABS tablet Take 1 tablet (90 mg total) by mouth 2 (two) times daily.     New complaints: None today  Social history: Is a Engineer, structural in [place]: Negative for activity change and appetite change.  HENT: Negative.   Eyes: Negative for pain.  Respiratory: Negative for shortness of breath.   Cardiovascular: Negative for chest pain, palpitations and leg swelling.  Gastrointestinal: Negative for abdominal pain.  Endocrine: Negative for polydipsia.  Genitourinary: Negative.   Skin: Negative for rash.  Neurological: Negative for dizziness,  weakness and headaches.  Hematological: Does not bruise/bleed easily.  Psychiatric/Behavioral: Negative.   All other systems reviewed and are negative.      Objective:   Physical Exam  Constitutional: He is oriented to person, place, and time. He appears well-developed and well-nourished.  HENT:  Head: Normocephalic.  Nose: Nose normal.  Mouth/Throat: Oropharynx is clear and moist.  Eyes: Pupils are equal, round, and reactive to light. EOM are normal.  Neck: Normal range of motion and phonation normal. Neck supple. No JVD present. Carotid bruit is not present. No thyroid mass and no thyromegaly present.  Cardiovascular: Normal rate and regular rhythm.  Pulmonary/Chest: Effort normal and breath sounds normal. No respiratory distress.  Abdominal: Soft. Normal appearance, normal aorta and bowel sounds are normal. There is no tenderness.  Musculoskeletal: Normal range of motion.  Lymphadenopathy:    He has no cervical adenopathy.  Neurological: He is alert and oriented to person, place, and time.  Skin: Skin is warm and dry.  Psychiatric: He has a normal mood and affect. His behavior is normal. Judgment and thought content normal.  Nursing note and vitals reviewed.   BP 108/72   Pulse 66   Temp (!) 97 F (36.1 C) (Oral)   Ht 6' 2"  (1.88 m)   Wt 279 lb (126.6 kg)   BMI 35.82 kg/m        Assessment & Plan:  Cameron [name] comes in today with chief complaint of Medical  Management of Chronic Issues   Diagnosis and orders addressed:  1. NSTEMI (non-ST elevated myocardial infarction) [facility]) Needs to see cardiology in april  2. Essential hypertension Low sodium diet - CMP14+EGFR  3. Coronary artery disease involving native coronary artery of native heart without angina pectoris - metoprolol tartrate (LOPRESSOR) 25 MG tablet; Take 1 tablet (25 mg total) by mouth 2 (two) times daily.  Dispense: 180 tablet; Refill: 1  4. Pure hypercholesterolemia Low fat diet - Lipid  panel - atorvastatin (LIPITOR) 80 MG tablet; TAKE 1 TABLET DAILY AT 6PM  Dispense: 90 tablet; Refill: 1  5. Tobacco abuse Need to stop dipping  6. Need for hepatitis C screening test - Hepatitis C antibody  7. BMI 35.0-35.9,adult He is very muscular so I do not feel that he needs to loose weight   Labs pending Health Maintenance reviewed Diet and exercise encouraged  Follow up plan: 6 months   [place], FNP

## 2018-06-02 NOTE — Patient Instructions (Signed)

## 2018-06-03 LAB — CMP14+EGFR
A/G RATIO: 1.8 (ref 1.2–2.2)
ALK PHOS: 84 IU/L (ref 39–117)
ALT: 38 IU/L (ref 0–44)
AST: 27 IU/L (ref 0–40)
Albumin: 4.3 g/dL (ref 3.5–5.5)
BILIRUBIN TOTAL: 0.6 mg/dL (ref 0.0–1.2)
BUN / CREAT RATIO: 10 (ref 9–20)
BUN: 10 mg/dL (ref 6–24)
CHLORIDE: 104 mmol/L (ref 96–106)
CO2: 24 mmol/L (ref 20–29)
Calcium: 9.4 mg/dL (ref 8.7–10.2)
Creatinine, Ser: 1.05 mg/dL (ref 0.76–1.27)
GFR calc non Af Amer: 80 mL/min/{1.73_m2} (ref >59)
GFR, EST AFRICAN AMERICAN: 93 mL/min/{1.73_m2} (ref >59)
GLUCOSE: 93 mg/dL (ref 65–99)
Globulin, Total: 2.4 g/dL (ref 1.5–4.5)
POTASSIUM: 4.6 mmol/L (ref 3.5–5.2)
Sodium: 141 mmol/L (ref 134–144)
TOTAL PROTEIN: 6.7 g/dL (ref 6.0–8.5)

## 2018-06-03 LAB — LIPID PANEL
CHOLESTEROL TOTAL: 124 mg/dL (ref 100–199)
Chol/HDL Ratio: 2.9 ratio (ref 0.0–5.0)
HDL: 43 mg/dL (ref >39)
LDL Calculated: 67 mg/dL (ref 0–99)
Triglycerides: 71 mg/dL (ref 0–149)
VLDL Cholesterol Cal: 14 mg/dL (ref 5–40)

## 2018-06-03 LAB — HEPATITIS C ANTIBODY

## 2018-06-08 ENCOUNTER — Other Ambulatory Visit: Payer: Self-pay | Admitting: *Deleted

## 2018-06-08 DIAGNOSIS — I214 Non-ST elevation (NSTEMI) myocardial infarction: Secondary | ICD-10-CM

## 2018-06-08 MED ORDER — TICAGRELOR 90 MG PO TABS: 90.0000 mg | ORAL_TABLET | Freq: Two times a day (BID) | ORAL | 1 refills | Status: DC

## 2018-06-19 IMAGING — XA DG FLUORO GUIDE NDL PLC/BX
1 series · 1 of 1 positions shown · non-contrast
Comparison: none

CLINICAL DATA: LEFT knee pain.

[Series 1: ortho standard · 1 of 1 slices shown]
[im 1/1]
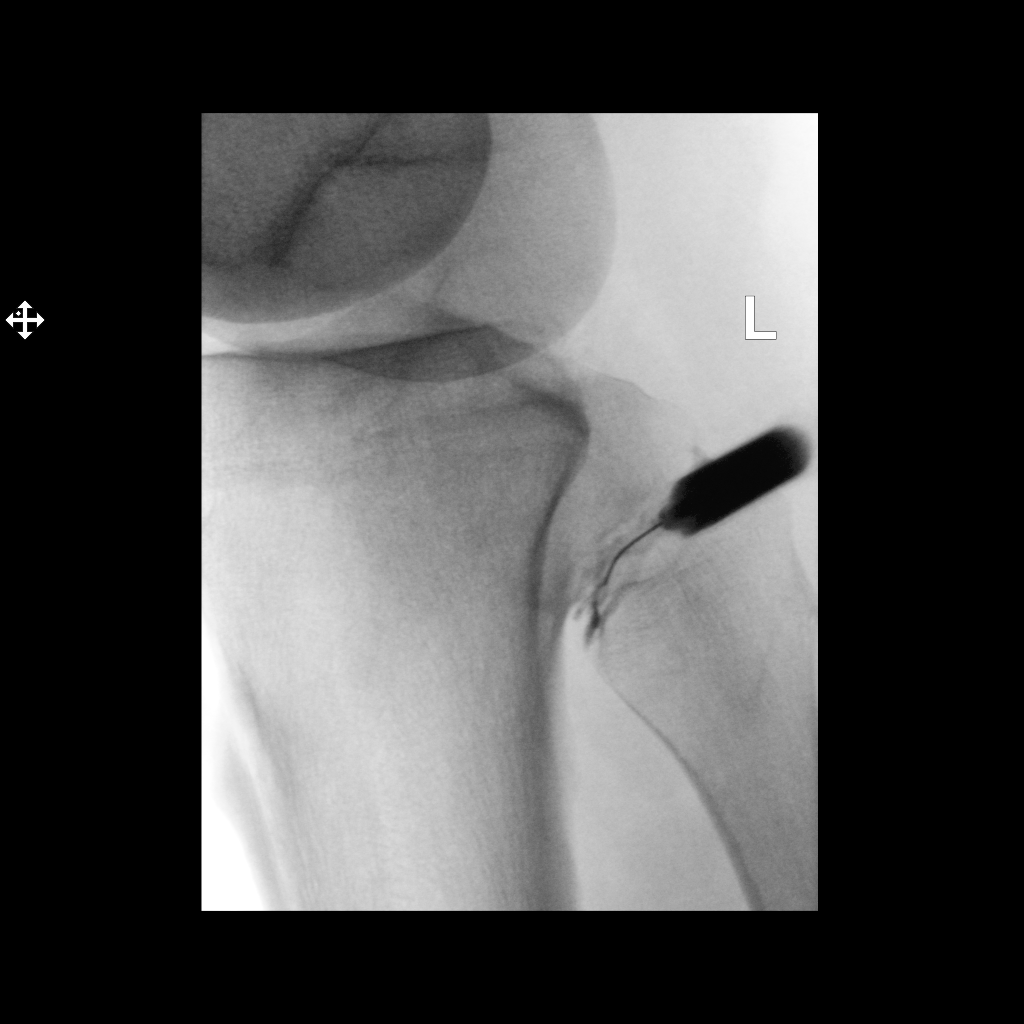

[1 of 1 positions shown; findings below may reference images not displayed]

FLUOROSCOPY TIME:  49 seconds corresponding to a Dose Area Product
of 25.33 ?Gy*m2

PROCEDURE:
LEFT PROXIMAL TIBIA FIBULAR JOINT INJECTION UNDER FLUOROSCOPY

Informed written consent was obtained.  Time-out was performed.

An appropriate skin entrance site was determined. The site was
marked, prepped with Betadine, draped in the usual sterile fashion,
and infiltrated locally with 1% lidocaine.

A 25 gauge hypodermic needle was advanced into the joint while
injecting additional 1% lidocaine. Loss of resistance was obtained.
Contrast injection confirmed spread throughout the joint. I injected
60 mg Kenalog. The patient tolerated the procedure well.
IMPRESSION: Technically successful LEFT proximal tibia fibular joint injection
under fluoroscopy.

## 2018-07-01 IMAGING — CR DG CHEST 1V PORT
1 series · 1 of 1 positions shown · non-contrast
Comparison: None.

CLINICAL DATA: Substernal chest pain.

EXAM:
PORTABLE CHEST 1 VIEW

[AP]
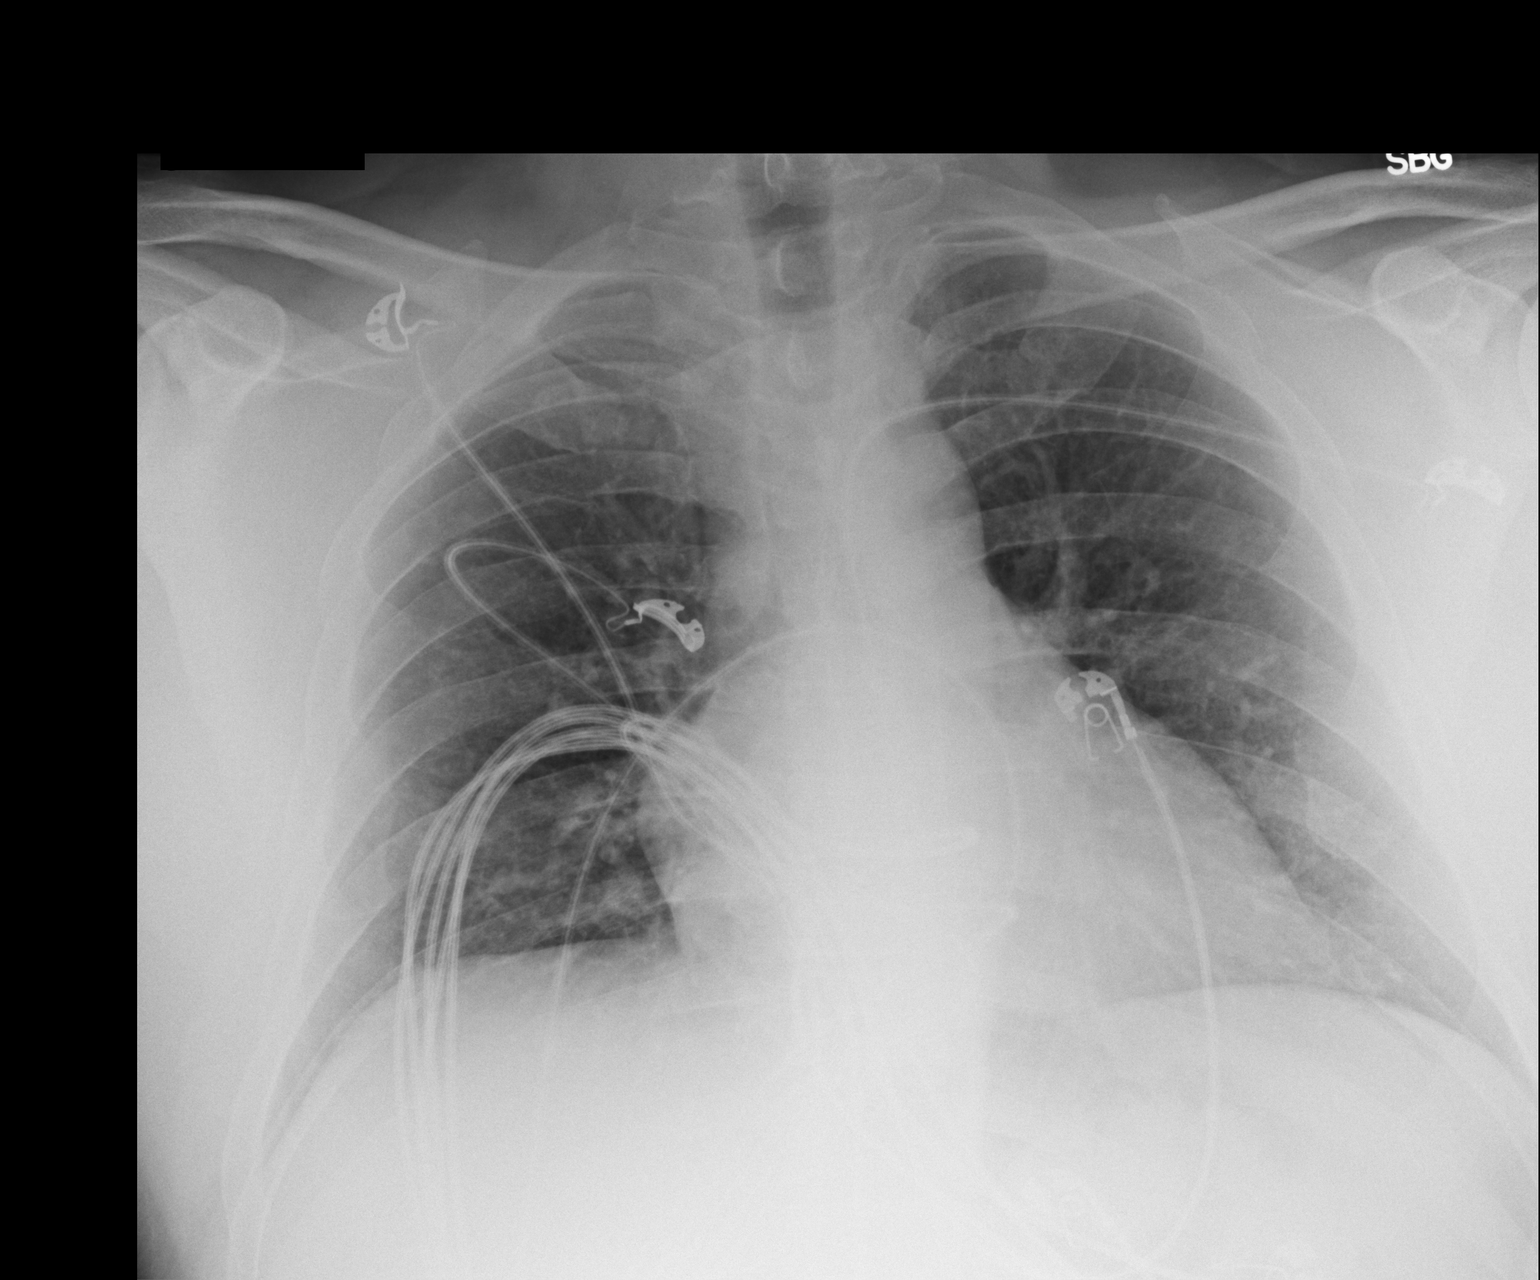

[1 of 1 positions shown; findings below may reference images not displayed]

FINDINGS: 3708 hours. Low volume film. The lungs are clear wiithout focal
pneumonia, edema, pneumothorax or pleural effusion. The
cardiopericardial silhouette is within normal limits for size. The
visualized bony structures of the thorax are intact. Telemetry leads
overlie the chest.
IMPRESSION: Low volume film without acute cardiopulmonary findings.

## 2018-09-28 NOTE — Telephone Encounter (Signed)
Opened encounter in pt's chart This encounter will be closed

## 2018-10-29 ENCOUNTER — Ambulatory Visit: Payer: Self-pay | Admitting: Cardiovascular Disease

## 2018-10-29 ENCOUNTER — Encounter

## 2018-10-29 NOTE — Progress Notes (Deleted)
No chief complaint on file.    History of Present Illness: 53 yo male with history of CAD, HTN, HLD and tobacco use who is here today for cardiac follow up. He was admitted to [facility] August 2017 with a NSTEMI. Cardiac cath with severe stenosis proximal LAD treated with a drug eluting stent and severe stenosis ostial Ramus intermediate branch which was treated with a drug eluting stent. Echo August 2017 with normal LV systolic function, no valve disese.   He is here today for follow up. The patient denies any chest pain, dyspnea, palpitations, lower extremity edema, orthopnea, PND, dizziness, near syncope or syncope.   Primary Care Physician: Cameron Pretty, FNP  Past Medical History:  Diagnosis Date  . Aseptic meningitis    a. 06/2003.  Marland Kitchen CAD (coronary artery disease)    a. NSTEMI: [facility] on 05/08/16 which showed severe double vessel CAD with prox-mid LAD 80% thrombotic stenosis s/p PCI/DES, oRI 95% sten s/p PCI/DES.  Marland Kitchen HLD (hyperlipidemia)   . HTN (hypertension)   . Osteoarthritis    a. s/p L Total Knee Arthroplasty.  . Tobacco abuse    a. occasional cigarette - approx 1 pack/month.    Past Surgical History:  Procedure Laterality Date  . CARDIAC CATHETERIZATION N/A 05/08/2016   Procedure: Left Heart Cath and Coronary Angiography;  Surgeon: Cameron Bush, MD;  Location: [place] [facility];  Service: Cardiovascular;  Laterality: N/A;  . CARDIAC CATHETERIZATION N/A 05/08/2016   Procedure: Coronary Stent Intervention;  Surgeon: Cameron Bush, MD;  Location: [place] [facility];  Service: Cardiovascular;  Laterality: N/A;  Mid LAD Ostial/Prox Ramus  . JOINT REPLACEMENT Left 11/12   knee  . KNEE SURGERY      Current Outpatient Medications  Medication Sig Dispense Refill  . aspirin 81 MG tablet Take 81 mg by mouth daily.    Marland Kitchen atorvastatin (LIPITOR) 80 MG tablet TAKE 1 TABLET DAILY AT 6PM 90 tablet 1  . metoprolol tartrate (LOPRESSOR) 25 MG tablet Take 1 tablet (25 mg total) by  mouth 2 (two) times daily. 180 tablet 1  . nitroGLYCERIN (NITROSTAT) 0.4 MG SL tablet Place 1 tablet (0.4 mg total) under the tongue every 5 (five) minutes x 3 doses as needed for chest pain. 25 tablet 12  . sildenafil (VIAGRA) 50 MG tablet Take 1 tablet (50 mg total) by mouth as needed for erectile dysfunction. 20 tablet 1  . ticagrelor (BRILINTA) 90 MG TABS tablet Take 1 tablet (90 mg total) by mouth 2 (two) times daily. 180 tablet 1   No current facility-administered medications for this visit.     No Known Allergies  Social History   Socioeconomic History  . Marital status: Married    Spouse name: Not on file  . Number of children: Not on file  . Years of education: Not on file  . Highest education level: Not on file  Occupational History  . Occupation: Chemical engineer: [employer]  Social Needs  . Financial resource strain: Not on file  . Food insecurity:    Worry: Not on file    Inability: Not on file  . Transportation needs:    Medical: Not on file    Non-medical: Not on file  Tobacco Use  . Smoking status: Former Smoker    Last attempt to quit: 01/27/2001    Years since quitting: 17.7  . Smokeless tobacco: Current User    Types: Snuff  . Tobacco comment: smokes about 1 pack  of cigarettes/month.  Substance and Sexual Activity  . Alcohol use: Yes    Comment: occasional.  . Drug use: No  . Sexual activity: Yes  Lifestyle  . Physical activity:    Days per week: Not on file    Minutes per session: Not on file  . Stress: Not on file  Relationships  . Social connections:    Talks on phone: Not on file    Gets together: Not on file    Attends religious service: Not on file    Active member of club or organization: Not on file    Attends meetings of clubs or organizations: Not on file    Relationship status: Not on file  . Intimate partner violence:    Fear of current or ex partner: Not on file    Emotionally abused: Not on file    Physically abused:  Not on file    Forced sexual activity: Not on file  Other Topics Concern  . Not on file  Social History Narrative   Lives in [place] with wife and children.  Regularly exercises.    Family History  Problem Relation Age of Onset  . Thyroid disease Mother   . Cancer Mother        moles- removed  . Stroke Father        died in mid 34's during lower ext bypass  . Heart attack Father     Review of Systems:  As stated in the HPI and otherwise negative.   There were no vitals taken for this visit.  Physical Examination: General: Well developed, well nourished, NAD  HEENT: OP clear, mucus membranes moist  SKIN: warm, dry. No rashes. Neuro: No focal deficits  Musculoskeletal: Muscle strength 5/5 all ext  Psychiatric: Mood and affect normal  Neck: No JVD, no carotid bruits, no thyromegaly, no lymphadenopathy.  Lungs:Clear bilaterally, no wheezes, rhonci, crackles Cardiovascular: Regular rate and rhythm. No murmurs, gallops or rubs. Abdomen:Soft. Bowel sounds present. Non-tender.  Extremities: No lower extremity edema. Pulses are 2 + in the bilateral DP/PT.  Echo August 2017: Left ventricle: The cavity size was normal. Systolic function was   normal. The estimated ejection fraction was in the range of 60%   to 65%. Wall motion was normal; there were no regional wall   motion abnormalities. Left ventricular diastolic function   parameters were normal. Doppler parameters are consistent with   indeterminate ventricular filling pressure. - Aortic valve: Transvalvular velocity was within the normal range.   There was no stenosis. There was no regurgitation. - Mitral valve: Transvalvular velocity was within the normal range.   There was no evidence for stenosis. There was trivial   regurgitation. - Right ventricle: The cavity size was normal. Wall thickness was   normal. Systolic function was normal. - Tricuspid valve: There was trivial regurgitation. - Pulmonary arteries:  Systolic pressure was within the normal   range. PA peak pressure: 27 mm Hg (S).  EKG:  EKG is not *** ordered today. The ekg ordered today demonstrates   Recent Labs: 11/24/2017: Hemoglobin 15.6; Platelets 247 06/02/2018: ALT 38; BUN 10; Creatinine, Ser 1.05; Potassium 4.6; Sodium 141   Lipid Panel    Component Value Date/Time   CHOL 124 06/02/2018 1040   TRIG 71 06/02/2018 1040   TRIG 121 11/01/2013 1019   HDL 43 06/02/2018 1040   HDL 41 11/01/2013 1019   CHOLHDL 2.9 06/02/2018 1040   CHOLHDL 4.9 05/09/2016 0039   VLDL 71 (H)  05/09/2016 0039   LDLCALC 67 06/02/2018 1040   LDLCALC 105 (H) 11/01/2013 1019     Wt Readings from Last 3 Encounters:  06/02/18 279 lb (126.6 kg)  03/27/18 276 lb (125.2 kg)  03/14/18 274 lb (124.3 kg)     Other studies Reviewed: Additional studies/ records that were reviewed today include: . Review of the above records demonstrates:   Assessment and Plan:  1. CAD without angina: He is doing well. He is very active and has no chest pain. *** Will plan to stop Brilinta.. Will continue ASA, statin and beta blocker.    2. HTN: BP is well controlled. No changes  3. Hyperlipidemia: LDL at goal September 2019. Continue statin.    Current medicines are reviewed at length with the patient today.  The patient does not have concerns regarding medicines.  The following changes have been made:  no change  Labs/ tests ordered today include:  No orders of the defined types were placed in this encounter.    Disposition:   FU with me in 6 months   Signed, [name], MD 10/29/2018 [facility] [place], [place], [place]  [address] Phone: [phone]; Fax: [phone]

## 2018-11-05 ENCOUNTER — Encounter: Payer: Self-pay | Admitting: Cardiovascular Disease

## 2018-11-16 ENCOUNTER — Telehealth: Payer: Self-pay | Admitting: Nurse Practitioner

## 2018-11-16 NOTE — Telephone Encounter (Signed)
appt scheduled Pt notified 

## 2018-11-17 ENCOUNTER — Ambulatory Visit: Payer: 59 | Admitting: Nurse Practitioner

## 2018-11-23 ENCOUNTER — Ambulatory Visit: Payer: 59 | Admitting: Cardiovascular Disease

## 2018-11-23 ENCOUNTER — Ambulatory Visit: Payer: Self-pay | Admitting: Cardiovascular Disease

## 2018-11-23 NOTE — Progress Notes (Deleted)
No chief complaint on file.    History of Present Illness: 53 yo male with history of CAD, HTN, HLD and smokeless tobacco use who is here today for cardiac follow up. He was admitted to [facility] August 2017 with a NSTEMI. Cardiac cath with severe stenosis proximal LAD treated with a drug eluting stent and severe stenosis ostial Ramus intermediate branch which was treated with a drug eluting stent. Echo August 2017 with normal LV systolic function, no valve disese.   He is here today for follow up. The patient denies any chest pain, dyspnea, palpitations, lower extremity edema, orthopnea, PND, dizziness, near syncope or syncope.   Primary Care Physician: Chevis Pretty, FNP   Past Medical History:  Diagnosis Date  . Aseptic meningitis    a. 06/2003.  Marland Kitchen CAD (coronary artery disease)    a. NSTEMI: [facility] on 05/08/16 which showed severe double vessel CAD with prox-mid LAD 80% thrombotic stenosis s/p PCI/DES, oRI 95% sten s/p PCI/DES.  Marland Kitchen HLD (hyperlipidemia)   . HTN (hypertension)   . Osteoarthritis    a. s/p L Total Knee Arthroplasty.  . Tobacco abuse    a. occasional cigarette - approx 1 pack/month.    Past Surgical History:  Procedure Laterality Date  . CARDIAC CATHETERIZATION N/A 05/08/2016   Procedure: Left Heart Cath and Coronary Angiography;  Surgeon: Nelva Bush, MD;  Location: [place] [facility];  Service: Cardiovascular;  Laterality: N/A;  . CARDIAC CATHETERIZATION N/A 05/08/2016   Procedure: Coronary Stent Intervention;  Surgeon: Nelva Bush, MD;  Location: [place] [facility];  Service: Cardiovascular;  Laterality: N/A;  Mid LAD Ostial/Prox Ramus  . JOINT REPLACEMENT Left 11/12   knee  . KNEE SURGERY      Current Outpatient Medications  Medication Sig Dispense Refill  . aspirin 81 MG tablet Take 81 mg by mouth daily.    Marland Kitchen atorvastatin (LIPITOR) 80 MG tablet TAKE 1 TABLET DAILY AT 6PM 90 tablet 1  . metoprolol tartrate (LOPRESSOR) 25 MG tablet Take 1 tablet (25  mg total) by mouth 2 (two) times daily. 180 tablet 1  . nitroGLYCERIN (NITROSTAT) 0.4 MG SL tablet Place 1 tablet (0.4 mg total) under the tongue every 5 (five) minutes x 3 doses as needed for chest pain. 25 tablet 12  . sildenafil (VIAGRA) 50 MG tablet Take 1 tablet (50 mg total) by mouth as needed for erectile dysfunction. 20 tablet 1  . ticagrelor (BRILINTA) 90 MG TABS tablet Take 1 tablet (90 mg total) by mouth 2 (two) times daily. 180 tablet 1   No current facility-administered medications for this visit.     No Known Allergies  Social History   Socioeconomic History  . Marital status: Married    Spouse name: Not on file  . Number of children: Not on file  . Years of education: Not on file  . Highest education level: Not on file  Occupational History  . Occupation: Chemical engineer: [employer]  Social Needs  . Financial resource strain: Not on file  . Food insecurity:    Worry: Not on file    Inability: Not on file  . Transportation needs:    Medical: Not on file    Non-medical: Not on file  Tobacco Use  . Smoking status: Former Smoker    Last attempt to quit: 01/27/2001    Years since quitting: 17.8  . Smokeless tobacco: Current User    Types: Snuff  . Tobacco comment: smokes about  1 pack of cigarettes/month.  Substance and Sexual Activity  . Alcohol use: Yes    Comment: occasional.  . Drug use: No  . Sexual activity: Yes  Lifestyle  . Physical activity:    Days per week: Not on file    Minutes per session: Not on file  . Stress: Not on file  Relationships  . Social connections:    Talks on phone: Not on file    Gets together: Not on file    Attends religious service: Not on file    Active member of club or organization: Not on file    Attends meetings of clubs or organizations: Not on file    Relationship status: Not on file  . Intimate partner violence:    Fear of current or ex partner: Not on file    Emotionally abused: Not on file     Physically abused: Not on file    Forced sexual activity: Not on file  Other Topics Concern  . Not on file  Social History Narrative   Lives in [place] with wife and children.  Regularly exercises.    Family History  Problem Relation Age of Onset  . Thyroid disease Mother   . Cancer Mother        moles- removed  . Stroke Father        died in mid 34's during lower ext bypass  . Heart attack Father     Review of Systems:  As stated in the HPI and otherwise negative.   There were no vitals taken for this visit.  Physical Examination: General: Well developed, well nourished, NAD  HEENT: OP clear, mucus membranes moist  SKIN: warm, dry. No rashes. Neuro: No focal deficits  Musculoskeletal: Muscle strength 5/5 all ext  Psychiatric: Mood and affect normal  Neck: No JVD, no carotid bruits, no thyromegaly, no lymphadenopathy.  Lungs:Clear bilaterally, no wheezes, rhonci, crackles Cardiovascular: Regular rate and rhythm. No murmurs, gallops or rubs. Abdomen:Soft. Bowel sounds present. Non-tender.  Extremities: No lower extremity edema. Pulses are 2 + in the bilateral DP/PT.  Echo August 2017: Left ventricle: The cavity size was normal. Systolic function was   normal. The estimated ejection fraction was in the range of 60%   to 65%. Wall motion was normal; there were no regional wall   motion abnormalities. Left ventricular diastolic function   parameters were normal. Doppler parameters are consistent with   indeterminate ventricular filling pressure. - Aortic valve: Transvalvular velocity was within the normal range.   There was no stenosis. There was no regurgitation. - Mitral valve: Transvalvular velocity was within the normal range.   There was no evidence for stenosis. There was trivial   regurgitation. - Right ventricle: The cavity size was normal. Wall thickness was   normal. Systolic function was normal. - Tricuspid valve: There was trivial regurgitation. -  Pulmonary arteries: Systolic pressure was within the normal   range. PA peak pressure: 27 mm Hg (S).  EKG:  EKG is not *** ordered today. The ekg ordered today demonstrates   Recent Labs: 11/24/2017: Hemoglobin 15.6; Platelets 247 06/02/2018: ALT 38; BUN 10; Creatinine, Ser 1.05; Potassium 4.6; Sodium 141   Lipid Panel    Component Value Date/Time   CHOL 124 06/02/2018 1040   TRIG 71 06/02/2018 1040   TRIG 121 11/01/2013 1019   HDL 43 06/02/2018 1040   HDL 41 11/01/2013 1019   CHOLHDL 2.9 06/02/2018 1040   CHOLHDL 4.9 05/09/2016 0039   VLDL  71 (H) 05/09/2016 0039   LDLCALC 67 06/02/2018 1040   LDLCALC 105 (H) 11/01/2013 1019     Wt Readings from Last 3 Encounters:  06/02/18 126.6 kg  03/27/18 125.2 kg  03/14/18 124.3 kg     Other studies Reviewed: Additional studies/ records that were reviewed today include: . Review of the above records demonstrates:   Assessment and Plan:  1. CAD without angina: He presented with a NSTEMI in August 2017 and had a drug eluting stent placed in the proximal to mid LAD and a drug eluting stent placed in the ostial Ramus intermediate branch. No chest pain. Will continue ASA, statin and beta blocker. *** Will stop Brilinta.    2. HTN: BP is well controlled  3. Hyperlipidemia: LDL is at goal. Continue statin.   Current medicines are reviewed at length with the patient today.  The patient does not have concerns regarding medicines.  The following changes have been made:  no change  Labs/ tests ordered today include:  No orders of the defined types were placed in this encounter.    Disposition:   FU with me in 12 months   Signed, [name], MD 11/23/2018 5:41 AM    [facility] [place], [place], [place]  [address] Phone: [phone]; Fax: [phone]

## 2018-11-26 ENCOUNTER — Encounter: Payer: Self-pay | Admitting: Nurse Practitioner

## 2018-12-01 ENCOUNTER — Encounter: Payer: Self-pay | Admitting: Nurse Practitioner

## 2018-12-01 ENCOUNTER — Ambulatory Visit (INDEPENDENT_AMBULATORY_CARE_PROVIDER_SITE_OTHER): Payer: 59 | Admitting: Nurse Practitioner

## 2018-12-01 VITALS — BP 123/79 | HR 83 | Temp 96.7°F | Ht 74.0 in | Wt 281.0 lb

## 2018-12-01 DIAGNOSIS — I2510 Atherosclerotic heart disease of native coronary artery without angina pectoris: Secondary | ICD-10-CM | POA: Diagnosis not present

## 2018-12-01 DIAGNOSIS — Z720 Tobacco use: Secondary | ICD-10-CM | POA: Diagnosis not present

## 2018-12-01 DIAGNOSIS — E7800 Pure hypercholesterolemia, unspecified: Secondary | ICD-10-CM | POA: Diagnosis not present

## 2018-12-01 DIAGNOSIS — Z1211 Encounter for screening for malignant neoplasm of colon: Secondary | ICD-10-CM

## 2018-12-01 DIAGNOSIS — I10 Essential (primary) hypertension: Secondary | ICD-10-CM | POA: Diagnosis not present

## 2018-12-01 DIAGNOSIS — Z6835 Body mass index (BMI) 35.0-35.9, adult: Secondary | ICD-10-CM

## 2018-12-01 DIAGNOSIS — Z1212 Encounter for screening for malignant neoplasm of rectum: Secondary | ICD-10-CM

## 2018-12-01 DIAGNOSIS — I214 Non-ST elevation (NSTEMI) myocardial infarction: Secondary | ICD-10-CM

## 2018-12-01 LAB — CMP14+EGFR
A/G RATIO: 2 (ref 1.2–2.2)
ALBUMIN: 4.5 g/dL (ref 3.8–4.9)
ALT: 38 IU/L (ref 0–44)
AST: 26 IU/L (ref 0–40)
Alkaline Phosphatase: 82 IU/L (ref 39–117)
BUN/Creatinine Ratio: 13 (ref 9–20)
BUN: 15 mg/dL (ref 6–24)
Bilirubin Total: 0.8 mg/dL (ref 0.0–1.2)
CALCIUM: 9.6 mg/dL (ref 8.7–10.2)
CO2: 23 mmol/L (ref 20–29)
Chloride: 102 mmol/L (ref 96–106)
Creatinine, Ser: 1.14 mg/dL (ref 0.76–1.27)
GFR, EST AFRICAN AMERICAN: 84 mL/min/{1.73_m2} (ref >59)
GFR, EST NON AFRICAN AMERICAN: 73 mL/min/{1.73_m2} (ref >59)
Globulin, Total: 2.2 g/dL (ref 1.5–4.5)
Glucose: 96 mg/dL (ref 65–99)
POTASSIUM: 4.5 mmol/L (ref 3.5–5.2)
SODIUM: 142 mmol/L (ref 134–144)
TOTAL PROTEIN: 6.7 g/dL (ref 6.0–8.5)

## 2018-12-01 LAB — LIPID PANEL
CHOL/HDL RATIO: 2.8 ratio (ref 0.0–5.0)
Cholesterol, Total: 112 mg/dL (ref 100–199)
HDL: 40 mg/dL (ref >39)
LDL Calculated: 57 mg/dL (ref 0–99)
Triglycerides: 76 mg/dL (ref 0–149)
VLDL Cholesterol Cal: 15 mg/dL (ref 5–40)

## 2018-12-01 MED ORDER — TICAGRELOR 90 MG PO TABS: 90.0000 mg | ORAL_TABLET | Freq: Two times a day (BID) | ORAL | 1 refills | Status: DC

## 2018-12-01 MED ORDER — METOPROLOL TARTRATE 25 MG PO TABS: 25.0000 mg | ORAL_TABLET | Freq: Two times a day (BID) | ORAL | 1 refills | Status: DC

## 2018-12-01 MED ORDER — ATORVASTATIN CALCIUM 80 MG PO TABS: ORAL_TABLET | ORAL | 1 refills | Status: DC

## 2018-12-01 NOTE — Addendum Note (Signed)
Addended by: Chevis Pretty on: 12/01/2018 10:37 AM   Modules accepted: Orders

## 2018-12-01 NOTE — Patient Instructions (Signed)

## 2018-12-01 NOTE — Progress Notes (Signed)
Subjective:    Patient ID: Cameron [name], male    DOB: Oct 14, 1965, 53 y.o.   MRN: [id number]   Chief Complaint: medical management of chronic issues  HPI:  1. Essential hypertension  No c/o chest pain, sob or headache. Does not check blood pressure at home.  2. Coronary artery disease involving native coronary artery of native heart without angina pectoris he had anon stemi MI 2 years ago. Has since recovered well. See cardiology every 6 months.  3. Pure hypercholesterolemia  Has really been trying to watch diet since his MI. He exercises on a regular basis  4. Tobacco abuse  Stopped smoking years ago but is still dipping  5. BMI 35.0-35.9,adult  No recent weight changes. His BMI is elevated but he is very muscular.    Outpatient Encounter Medications as of 12/01/2018  Medication Sig  . aspirin 81 MG tablet Take 81 mg by mouth daily.  Marland Kitchen atorvastatin (LIPITOR) 80 MG tablet TAKE 1 TABLET DAILY AT 6PM  . metoprolol tartrate (LOPRESSOR) 25 MG tablet Take 1 tablet (25 mg total) by mouth 2 (two) times daily.  . nitroGLYCERIN (NITROSTAT) 0.4 MG SL tablet Place 1 tablet (0.4 mg total) under the tongue every 5 (five) minutes x 3 doses as needed for chest pain.  . sildenafil (VIAGRA) 50 MG tablet Take 1 tablet (50 mg total) by mouth as needed for erectile dysfunction.  . ticagrelor (BRILINTA) 90 MG TABS tablet Take 1 tablet (90 mg total) by mouth 2 (two) times daily.   No facility-administered encounter medications on file as of 12/01/2018.       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  HENT: Negative.   Eyes: Negative for pain.  Respiratory: Negative for shortness of breath.   Cardiovascular: Negative for chest pain, palpitations and leg swelling.  Gastrointestinal: Negative for abdominal pain.  Endocrine: Negative for polydipsia.    Genitourinary: Negative.   Skin: Negative for rash.  Neurological: Negative for dizziness, weakness and headaches.  Hematological: Does not bruise/bleed easily.  Psychiatric/Behavioral: Negative.   All other systems reviewed and are negative.      Objective:   Physical Exam Vitals signs and nursing note reviewed.  Constitutional:      Appearance: Normal appearance. He is well-developed.  HENT:     Head: Normocephalic.     Nose: Nose normal.  Eyes:     Pupils: Pupils are equal, round, and reactive to light.  Neck:     Musculoskeletal: Normal range of motion and neck supple.     Thyroid: No thyroid mass or thyromegaly.     Vascular: No carotid bruit or JVD.     Trachea: Phonation normal.  Cardiovascular:     Rate and Rhythm: Normal rate and regular rhythm.  Pulmonary:     Effort: Pulmonary effort is normal. No respiratory distress.     Breath sounds: Normal breath sounds.  Abdominal:     General: Bowel sounds are normal.     Palpations: Abdomen is soft.     Tenderness: There is no abdominal tenderness.  Musculoskeletal: Normal range of motion.  Lymphadenopathy:     Cervical: No cervical adenopathy.  Skin:    General: Skin is warm and dry.  Neurological:     Mental Status: He is alert and oriented to person, place, and time.  Psychiatric:  Behavior: Behavior normal.        Thought Content: Thought content normal.        Judgment: Judgment normal.    BP 123/79   Pulse 83   Temp (!) 96.7 F (35.9 C) (Oral)   Ht _0  (1.88 m)   Wt 281 lb (127.5 kg)   BMI 36.08 kg/m       Assessment & Plan:  Cameron [name] comes in today with chief complaint of Medical Management of Chronic Issues   Diagnosis and orders addressed:  1. Essential hypertension Low sodium diet - CMP14+EGFR  2. Coronary artery disease involving native coronary artery of native heart without angina pectoris Keep yearly follow up with crdiology - metoprolol tartrate (LOPRESSOR) 25 MG  tablet; Take 1 tablet (25 mg total) by mouth 2 (two) times daily.  Dispense: 180 tablet; Refill: 1  3. Pure hypercholesterolemia Low fat diet - atorvastatin (LIPITOR) 80 MG tablet; TAKE 1 TABLET DAILY AT 6PM  Dispense: 90 tablet; Refill: 1 - Lipid panel  4. Tobacco abuse Cut back  On dipping  5. BMI 35.0-35.9,adult Discussed diet and exercise for person with BMI >25 Will recheck weight in 3-6 months  6. NSTEMI (non-ST elevated myocardial infarction) (HCC) - ticagrelor (BRILINTA) 90 MG TABS tablet; Take 1 tablet (90 mg total) by mouth 2 (two) times daily.  Dispense: 180 tablet; Refill: 1   Labs pending Health Maintenance reviewed Diet and exercise encouraged  Follow up plan: 6 months   Mary-Margaret Hassell Done, FNP

## 2019-02-10 ENCOUNTER — Telehealth: Payer: Self-pay | Admitting: *Deleted

## 2019-02-10 NOTE — Telephone Encounter (Signed)
Patient wants an in office appt only , does not want virtual .   ----- Message -----  From: [name]  Sent: 02/08/2019  9:56 AM EDT  To: [address] Scheduling  Subject: Appointment Request                 Appointment Request From: [name]    With Provider: Angelena Form, PA-C [place]    Preferred Date Range: Any    Preferred Times: Any time    Reason for visit: Request an Appointment    Comments:  Check up missed last one due to Covid 19 restrictions     I placed call to pt and left message to call office.

## 2019-03-15 NOTE — Telephone Encounter (Signed)
I spoke with pt and scheduled him to see Dr. Angelena Form on July 2,2020 at 4:20

## 2019-03-24 ENCOUNTER — Telehealth: Payer: Self-pay | Admitting: Cardiovascular Disease

## 2019-03-24 NOTE — Telephone Encounter (Signed)
New Message     Left message to confirm app and answer COVID Questions

## 2019-03-25 ENCOUNTER — Ambulatory Visit: Payer: 59 | Admitting: Cardiovascular Disease

## 2019-04-01 ENCOUNTER — Telehealth: Payer: Self-pay | Admitting: Physician Assistant

## 2019-04-01 NOTE — Progress Notes (Addendum)
Cardiology Office Note    Date:  04/02/2019  ID:  Cameron [name], DOB 12/06/1965, MRN [id number] PCP:  Chevis Pretty, FNP  Cardiologist:  [name], MD   Chief Complaint: overdue 1 year f/u CAD (last seen 10/2017)  History of Present Illness:  Cameron [name] is a 53 y.o. 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   He was admitted to [facility] 04/2016 with a NSTEMI. Cardiac cath with severe stenosis proximal LAD treated with a drug eluting stent and severe stenosis ostial Ramus intermediate branch which was treated with a drug eluting stent. Echo August 2017 showed normal LV systolic function EF 93-90%, normal diastolic function, no significant valvular disease, normal RV. Last labs 11/2018 showed LDL 57, CMET wnl with Cr 1.14, normal LFTs, last CBC 11/2017 Hgb 15.6. Per last OV 10/2017 M. Lenze had spoken with Dr. Angelena Form about plan for long term DAPT and Dr. Angelena Form recommended continuing this long term.   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 He also works part time in Biomedical scientist and remains very active working out with his son who is a Paramedic as well. No chest pain, SOB, palpitations, edema, orthopnea, syncope or bleeding. Tolerating all meds fine including Brilinta, no concerns today.   Past Medical History:  Diagnosis Date  . Aseptic meningitis    a. 06/2003.  Marland Kitchen CAD (coronary artery disease)    a. NSTEMI: [facility] on 05/08/16 which showed severe double vessel CAD with prox-mid LAD 80% thrombotic stenosis s/p PCI/DES, oRI 95% sten s/p PCI/DES.  Marland Kitchen HLD (hyperlipidemia)   . HTN (hypertension)   . Osteoarthritis    a. s/p L Total Knee Arthroplasty.  . Tobacco abuse    a. occasional cigarette - approx 1 pack/month.    Past Surgical History:  Procedure Laterality Date  . CARDIAC CATHETERIZATION N/A 05/08/2016    Procedure: Left Heart Cath and Coronary Angiography;  Surgeon: Nelva Bush, MD;  Location: [place] [facility];  Service: Cardiovascular;  Laterality: N/A;  . CARDIAC CATHETERIZATION N/A 05/08/2016   Procedure: Coronary Stent Intervention;  Surgeon: Nelva Bush, MD;  Location: [place] [facility];  Service: Cardiovascular;  Laterality: N/A;  Mid LAD Ostial/Prox Ramus  . JOINT REPLACEMENT Left 11/12   knee  . KNEE SURGERY      Current Medications: Current Meds  Medication Sig  . aspirin 81 MG tablet Take 81 mg by mouth daily.  Marland Kitchen atorvastatin (LIPITOR) 80 MG tablet TAKE 1 TABLET DAILY AT 6PM  . metoprolol tartrate (LOPRESSOR) 25 MG tablet Take 1 tablet (25 mg total) by mouth 2 (two) times daily.  . nitroGLYCERIN (NITROSTAT) 0.4 MG SL tablet Place 1 tablet (0.4 mg total) under the tongue every 5 (five) minutes x 3 doses as needed for chest pain.  . sildenafil (VIAGRA) 50 MG tablet Take 1 tablet (50 mg total) by mouth as needed for erectile dysfunction.  . ticagrelor (BRILINTA) 90 MG TABS tablet Take 1 tablet (90 mg total) by mouth 2 (two) times daily.     Allergies:   Patient has no known allergies.   Social History   Socioeconomic History  . Marital status: Married    Spouse name: Not on file  . Number of children: Not on file  . Years of education: Not on file  . Highest education  level: Not on file  Occupational History  . Occupation: Chemical engineer: [employer]  Social Needs  . Financial resource strain: Not on file  . Food insecurity    Worry: Not on file    Inability: Not on file  . Transportation needs    Medical: Not on file    Non-medical: Not on file  Tobacco Use  . Smoking status: Former Smoker    Quit date: 01/27/2001    Years since quitting: 18.1  . Smokeless tobacco: Current User    Types: Snuff  . Tobacco comment: smokes about 1 pack of cigarettes/month.  Substance and Sexual Activity  . Alcohol use: Yes    Comment: occasional.  . Drug  use: No  . Sexual activity: Yes  Lifestyle  . Physical activity    Days per week: Not on file    Minutes per session: Not on file  . Stress: Not on file  Relationships  . Social Herbalist on phone: Not on file    Gets together: Not on file    Attends religious service: Not on file    Active member of club or organization: Not on file    Attends meetings of clubs or organizations: Not on file    Relationship status: Not on file  Other Topics Concern  . Not on file  Social History Narrative   Lives in [place] with wife and children.  Regularly exercises.     Family History:  The patient's family history includes Cancer in his mother; Heart attack in his father; Stroke in his father; Thyroid disease in his mother.  ROS:   Please see the history of present illness. All other systems are reviewed and otherwise negative.    PHYSICAL EXAM:   VS:  BP 112/80   Pulse 61   Ht 6\' 2"  (1.88 m)   Wt 280 lb 12.8 oz (127.4 kg)   SpO2 95%   BMI 36.05 kg/m   BMI: Body mass index is 36.05 kg/m. GEN: Well nourished, well developed fit appearing WM in no acute distress HEENT: normocephalic, atraumatic Neck: no JVD, carotid bruits, or masses Cardiac: RRR; no murmurs, rubs, or gallops, no edema  Respiratory:  clear to auscultation bilaterally, normal work of breathing GI: soft, nontender, nondistended, + BS MS: no deformity or atrophy Skin: warm and dry, no rash Neuro:  Alert and Oriented x 3, Strength and sensation are intact, follows commands Psych: euthymic mood, full affect  Wt Readings from Last 3 Encounters:  04/02/19 280 lb 12.8 oz (127.4 kg)  12/01/18 281 lb (127.5 kg)  06/02/18 279 lb (126.6 kg)      Studies/Labs Reviewed:   EKG:  EKG was ordered today and personally reviewed by me and demonstrates NSR 61bpm, no acute changes.  Recent Labs: 12/01/2018: ALT 38; BUN 15; Creatinine, Ser 1.14; Potassium 4.5; Sodium 142   Lipid Panel    Component Value  Date/Time   CHOL 112 12/01/2018 1049   TRIG 76 12/01/2018 1049   TRIG 121 11/01/2013 1019   HDL 40 12/01/2018 1049   HDL 41 11/01/2013 1019   CHOLHDL 2.8 12/01/2018 1049   CHOLHDL 4.9 05/09/2016 0039   VLDL 71 (H) 05/09/2016 0039   LDLCALC 57 12/01/2018 1049   LDLCALC 105 (H) 11/01/2013 1019    Additional studies/ records that were reviewed today include: Summarized above.   ASSESSMENT & PLAN:   1. CAD - asymptomatic, doing well. Per notes, Dr.  McAlhany has recommended long term DAPT with ASA/Brilinta. I will reach out to him to make sure he intends for this to be the 90mg  BID dose. We discussed avoidance of NTG within 48 hours of sildenafil and vice versa. He also needs an updated yearly CBC given DAPT; will obtain today. Addendum: Dr. Angelena Form recommends to continue present doses. 2. Tobacco abuse - he has not smoked since his 31s but does use chew tobacco. Complete tobacco abstinence was recommended. 3. HTN - blood pressure well controlled. Continue present regimen. 4. Hyperlipidemia - lipids have been followed by PCP with last LDL at goal in 11/2018.  Disposition: F/u with Dr. Angelena Form in 1 year.   Medication Adjustments/Labs and Tests Ordered: Current medicines are reviewed at length with the patient today.  Concerns regarding medicines are outlined above. Medication changes, Labs and Tests ordered today are summarized above and listed in the Patient Instructions accessible in Encounters.   Signed, Charlie Pitter, PA-C  04/02/2019 10:20 AM    [facility] [address], [place], [place]  [address] Phone: [phone]; Fax: [phone]

## 2019-04-01 NOTE — Telephone Encounter (Signed)
New Message ° ° ° ° °Left message to confirm appt and answer COVID Questions  °

## 2019-04-02 ENCOUNTER — Ambulatory Visit: Payer: 59 | Admitting: Physician Assistant

## 2019-04-02 ENCOUNTER — Encounter: Payer: Self-pay | Admitting: Physician Assistant

## 2019-04-02 ENCOUNTER — Other Ambulatory Visit: Payer: Self-pay

## 2019-04-02 VITALS — BP 112/80 | HR 61 | Ht 74.0 in | Wt 280.8 lb

## 2019-04-02 DIAGNOSIS — I2510 Atherosclerotic heart disease of native coronary artery without angina pectoris: Secondary | ICD-10-CM

## 2019-04-02 DIAGNOSIS — I10 Essential (primary) hypertension: Secondary | ICD-10-CM

## 2019-04-02 DIAGNOSIS — E785 Hyperlipidemia, unspecified: Secondary | ICD-10-CM | POA: Diagnosis not present

## 2019-04-02 DIAGNOSIS — Z720 Tobacco use: Secondary | ICD-10-CM | POA: Diagnosis not present

## 2019-04-02 LAB — CBC
Hematocrit: 44.9 % (ref 37.5–51.0)
Hemoglobin: 15.6 g/dL (ref 13.0–17.7)
MCH: 31.4 pg (ref 26.6–33.0)
MCHC: 34.7 g/dL (ref 31.5–35.7)
MCV: 90 fL (ref 79–97)
Platelets: 232 10*3/uL (ref 150–450)
RBC: 4.97 x10E6/uL (ref 4.14–5.80)
RDW: 12.4 % (ref 11.6–15.4)
WBC: 7.6 10*3/uL (ref 3.4–10.8)

## 2019-04-02 NOTE — Patient Instructions (Addendum)
Medication Instructions:  Your physician recommends that you continue on your current medications as directed. Please refer to the Current Medication list given to you today.  If you need a refill on your cardiac medications before your next appointment, please call your pharmacy.   Lab work: TODAY:  CBC  If you have labs (blood work) drawn today and your tests are completely normal, you will receive your results only by: Marland Kitchen MyChart Message (if you have MyChart) OR . A paper copy in the mail If you have any lab test that is abnormal or we need to change your treatment, we will call you to review the results.  Testing/Procedures: None ordered  Follow-Up: At [facility], you and your health needs are our priority.  As part of our continuing mission to provide you with exceptional heart care, we have created designated Provider Care Teams.  These Care Teams include your primary Cardiologist (physician) and Advanced Practice Providers (APPs -  Physician Assistants and Nurse Practitioners) who all work together to provide you with the care you need, when you need it. You will need a follow up appointment in 12 months.  Please call our office 2 months in advance to schedule this appointment.  You may see [name], MD or one of the following Advanced Practice Providers on your designated Care Team:   [place], PA-C Melina Copa, PA-C . Ermalinda Barrios, PA-C  Any Other Special Instructions Will Be Listed Below (If Applicable).  Just as a reminder, do not take nitroglycerin if you have taken sildenafil in the last 48 hours. The opposite is true as well. Do not take sildenafil if you have taken nitroglycerin in the last 48 hours. If you take these two medicines can cause low blood pressure if taken together.  If you notice any bleeding such as blood in stool, black tarry stools, blood in urine, nosebleeds or any other unusual bleeding, call your doctor immediately. It is not  normal to have this kind of bleeding while on a blood thinner and usually indicates there is an underlying problem with one of your body systems that needs to be checked out.

## 2019-04-05 NOTE — Progress Notes (Signed)
Pt has been made aware of normal result and verbalized understanding.  jw 04/05/2019

## 2019-04-15 ENCOUNTER — Other Ambulatory Visit: Payer: Self-pay | Admitting: Nurse Practitioner

## 2019-04-15 MED ORDER — SILDENAFIL CITRATE 50 MG PO TABS: 50.0000 mg | ORAL_TABLET | ORAL | 1 refills | Status: DC | PRN

## 2019-05-10 ENCOUNTER — Telehealth: Payer: 59 | Admitting: Physician Assistant

## 2019-05-21 MED ORDER — SILDENAFIL CITRATE 50 MG PO TABS: 50.0000 mg | ORAL_TABLET | ORAL | 1 refills | Status: DC | PRN

## 2019-05-21 NOTE — Telephone Encounter (Signed)
Refill sent to pahrmacy

## 2019-06-03 ENCOUNTER — Ambulatory Visit: Payer: 59 | Admitting: Nurse Practitioner

## 2019-06-07 ENCOUNTER — Other Ambulatory Visit: Payer: Self-pay

## 2019-06-08 ENCOUNTER — Encounter: Payer: Self-pay | Admitting: Nurse Practitioner

## 2019-06-08 ENCOUNTER — Ambulatory Visit: Payer: 59 | Admitting: Nurse Practitioner

## 2019-06-08 VITALS — BP 113/75 | HR 63 | Temp 97.7°F | Resp 16 | Ht 74.0 in | Wt 272.0 lb

## 2019-06-08 DIAGNOSIS — Z720 Tobacco use: Secondary | ICD-10-CM | POA: Diagnosis not present

## 2019-06-08 DIAGNOSIS — E7800 Pure hypercholesterolemia, unspecified: Secondary | ICD-10-CM

## 2019-06-08 DIAGNOSIS — I2510 Atherosclerotic heart disease of native coronary artery without angina pectoris: Secondary | ICD-10-CM

## 2019-06-08 DIAGNOSIS — I214 Non-ST elevation (NSTEMI) myocardial infarction: Secondary | ICD-10-CM

## 2019-06-08 DIAGNOSIS — Z6835 Body mass index (BMI) 35.0-35.9, adult: Secondary | ICD-10-CM

## 2019-06-08 DIAGNOSIS — I10 Essential (primary) hypertension: Secondary | ICD-10-CM

## 2019-06-08 DIAGNOSIS — Z125 Encounter for screening for malignant neoplasm of prostate: Secondary | ICD-10-CM

## 2019-06-08 MED ORDER — METOPROLOL TARTRATE 25 MG PO TABS: 25.0000 mg | ORAL_TABLET | Freq: Two times a day (BID) | ORAL | 1 refills | Status: DC

## 2019-06-08 MED ORDER — ATORVASTATIN CALCIUM 80 MG PO TABS: ORAL_TABLET | ORAL | 1 refills | Status: DC

## 2019-06-08 MED ORDER — TICAGRELOR 90 MG PO TABS: 90.0000 mg | ORAL_TABLET | Freq: Two times a day (BID) | ORAL | 1 refills | Status: DC

## 2019-06-08 MED ORDER — SILDENAFIL CITRATE 50 MG PO TABS: 50.0000 mg | ORAL_TABLET | ORAL | 1 refills | Status: DC | PRN

## 2019-06-08 NOTE — Progress Notes (Signed)
Subjective:    Patient ID: Cameron [name], male    DOB: 02-18-1966, 53 y.o.   MRN: [id number]   Chief Complaint: Medical Management of Chronic Issues    HPI:  1. Essential hypertension No c/o chest pain, sob and headache. Does not check blood pressure at home. BP Readings from Last 3 Encounters:  06/08/19 113/75  04/02/19 112/80  12/01/18 123/79     2. Pure hypercholesterolemia Watches diet and exercises daily Lab Results  Component Value Date   CHOL 112 12/01/2018   HDL 40 12/01/2018   LDLCALC 57 12/01/2018   TRIG 76 12/01/2018   CHOLHDL 2.8 12/01/2018     3. Coronary artery disease involving native coronary artery of native heart without angina pectoris Saw cardiologist on 04/02/19. According to note, there was no change to plan of care  4. Tobacco abuse Still dips 2-3 x a day  5. BMI 35.0-35.9,adult No recent weight changes BMI Readings from Last 3 Encounters:  06/08/19 34.92 kg/m  04/02/19 36.05 kg/m  12/01/18 36.08 kg/m   Wt Readings from Last 3 Encounters:  06/08/19 272 lb (123.4 kg)  04/02/19 280 lb 12.8 oz (127.4 kg)  12/01/18 281 lb (127.5 kg)       Outpatient Encounter Medications as of 06/08/2019  Medication Sig   aspirin 81 MG tablet Take 81 mg by mouth daily.   atorvastatin (LIPITOR) 80 MG tablet TAKE 1 TABLET DAILY AT 6PM   metoprolol tartrate (LOPRESSOR) 25 MG tablet Take 1 tablet (25 mg total) by mouth 2 (two) times daily.   nitroGLYCERIN (NITROSTAT) 0.4 MG SL tablet Place 1 tablet (0.4 mg total) under the tongue every 5 (five) minutes x 3 doses as needed for chest pain.   sildenafil (VIAGRA) 50 MG tablet Take 1 tablet (50 mg total) by mouth as needed for erectile dysfunction.   ticagrelor (BRILINTA) 90 MG TABS tablet Take 1 tablet (90 mg total) by mouth 2 (two) times daily.     Past Surgical History:  Procedure Laterality Date   CARDIAC CATHETERIZATION N/A 05/08/2016   Procedure: Left Heart Cath and Coronary Angiography;   Surgeon: Nelva Bush, MD;  Location: [place] [facility];  Service: Cardiovascular;  Laterality: N/A;   CARDIAC CATHETERIZATION N/A 05/08/2016   Procedure: Coronary Stent Intervention;  Surgeon: Nelva Bush, MD;  Location: [place] [facility];  Service: Cardiovascular;  Laterality: N/A;  Mid LAD Ostial/Prox Ramus   JOINT REPLACEMENT Left 11/12   knee   KNEE SURGERY      Family History  Problem Relation Age of Onset   Thyroid disease Mother    Cancer Mother        moles- removed   Stroke Father        died in mid 34's during lower ext bypass   Heart attack Father     New complaints: None today  Social history: Is retired from the police department  Controlled substance contract: n/a    Review of Systems  Constitutional: Negative for activity change and appetite change.  HENT: Negative.   Eyes: Negative for pain.  Respiratory: Negative for shortness of breath.   Cardiovascular: Negative for chest pain, palpitations and leg swelling.  Gastrointestinal: Negative for abdominal pain.  Endocrine: Negative for polydipsia.  Genitourinary: Negative.   Skin: Negative for rash.  Neurological: Negative for dizziness, weakness and headaches.  Hematological: Does not bruise/bleed easily.  Psychiatric/Behavioral: Negative.   All other systems reviewed and are negative.      Objective:  Physical Exam Vitals signs and nursing note reviewed.  Constitutional:      Appearance: Normal appearance. He is well-developed.  HENT:     Head: Normocephalic.     Nose: Nose normal.  Eyes:     Pupils: Pupils are equal, round, and reactive to light.  Neck:     Musculoskeletal: Normal range of motion and neck supple.     Thyroid: No thyroid mass or thyromegaly.     Vascular: No carotid bruit or JVD.     Trachea: Phonation normal.  Cardiovascular:     Rate and Rhythm: Normal rate and regular rhythm.  Pulmonary:     Effort: Pulmonary effort is normal. No respiratory distress.       Breath sounds: Normal breath sounds.  Abdominal:     General: Bowel sounds are normal.     Palpations: Abdomen is soft.     Tenderness: There is no abdominal tenderness.  Musculoskeletal: Normal range of motion.  Lymphadenopathy:     Cervical: No cervical adenopathy.  Skin:    General: Skin is warm and dry.  Neurological:     Mental Status: He is alert and oriented to person, place, and time.  Psychiatric:        Behavior: Behavior normal.        Thought Content: Thought content normal.        Judgment: Judgment normal.    BP 113/75    Pulse 63    Temp 97.7 F (36.5 C) (Oral)    Resp 16    Ht 6' 2"  (1.88 m)    Wt 272 lb (123.4 kg)    SpO2 96%    BMI 34.92 kg/m         Assessment & Plan:  Cameron [name] comes in today with chief complaint of Medical Management of Chronic Issues   Diagnosis and orders addressed:  1. Essential hypertension Low sodium diet - CMP14+EGFR  2. Pure hypercholesterolemia Low fat diet - atorvastatin (LIPITOR) 80 MG tablet; TAKE 1 TABLET DAILY AT 6PM  Dispense: 90 tablet; Refill: 1 - Lipid panel  3. Coronary artery disease involving native coronary artery of native heart without angina pectoris Keep yearly follow up with cardiology - metoprolol tartrate (LOPRESSOR) 25 MG tablet; Take 1 tablet (25 mg total) by mouth 2 (two) times daily.  Dispense: 180 tablet; Refill: 1  4. Tobacco abuse Stop dipping f possible  5. BMI 35.0-35.9,adult Discussed diet and exercise for person with BMI >25 Will recheck weight in 3-6 months  6. NSTEMI (non-ST elevated myocardial infarction) (HCC) - ticagrelor (BRILINTA) 90 MG TABS tablet; Take 1 tablet (90 mg total) by mouth 2 (two) times daily.  Dispense: 180 tablet; Refill: 1  7. Prostate cancer screening - PSA, total and free   Labs pending Health Maintenance reviewed Diet and exercise encouraged  Follow up plan: 6 months   [place], FNP

## 2019-06-08 NOTE — Patient Instructions (Signed)
Exercising to Stay Healthy To become healthy and stay healthy, it is recommended that you do moderate-intensity and vigorous-intensity exercise. You can tell that you are exercising at a moderate intensity if your heart starts beating faster and you start breathing faster but can still hold a conversation. You can tell that you are exercising at a vigorous intensity if you are breathing much harder and faster and cannot hold a conversation while exercising. Exercising regularly is important. It has many health benefits, such as:  Improving overall fitness, flexibility, and endurance.  Increasing bone density.  Helping with weight control.  Decreasing body fat.  Increasing muscle strength.  Reducing stress and tension.  Improving overall health. How often should I exercise? Choose an activity that you enjoy, and set realistic goals. Your health care provider can help you make an activity plan that works for you. Exercise regularly as told by your health care provider. This may include:  Doing strength training two times a week, such as: ? Lifting weights. ? Using resistance bands. ? Push-ups. ? Sit-ups. ? Yoga.  Doing a certain intensity of exercise for a given amount of time. Choose from these options: ? A total of 150 minutes of moderate-intensity exercise every week. ? A total of 75 minutes of vigorous-intensity exercise every week. ? A mix of moderate-intensity and vigorous-intensity exercise every week. Children, pregnant women, people who have not exercised regularly, people who are overweight, and older adults may need to talk with a health care provider about what activities are safe to do. If you have a medical condition, be sure to talk with your health care provider before you start a new exercise program. What are some exercise ideas? Moderate-intensity exercise ideas include:  Walking 1 mile (1.6 km) in about 15 minutes.  Biking.  Hiking.  Golfing.  Dancing.   Water aerobics. Vigorous-intensity exercise ideas include:  Walking 4.5 miles (7.2 km) or more in about 1 hour.  Jogging or running 5 miles (8 km) in about 1 hour.  Biking 10 miles (16.1 km) or more in about 1 hour.  Lap swimming.  Roller-skating or in-line skating.  Cross-country skiing.  Vigorous competitive sports, such as football, basketball, and soccer.  Jumping rope.  Aerobic dancing. What are some everyday activities that can help me to get exercise?  Yard work, such as: ? Pushing a lawn mower. ? Raking and bagging leaves.  Washing your car.  Pushing a stroller.  Shoveling snow.  Gardening.  Washing windows or floors. How can I be more active in my day-to-day activities?  Use stairs instead of an elevator.  Take a walk during your lunch break.  If you drive, park your car farther away from your work or school.  If you take public transportation, get off one stop early and walk the rest of the way.  Stand up or walk around during all of your indoor phone calls.  Get up, stretch, and walk around every 30 minutes throughout the day.  Enjoy exercise with a friend. Support to continue exercising will help you keep a regular routine of activity. What guidelines can I follow while exercising?  Before you start a new exercise program, talk with your health care provider.  Do not exercise so much that you hurt yourself, feel dizzy, or get very short of breath.  Wear comfortable clothes and wear shoes with good support.  Drink plenty of water while you exercise to prevent dehydration or heat stroke.  Work out until your breathing   and your heartbeat get faster. Where to find more information  [place] Department of Health and Human Services: www.hhs.gov  Centers for Disease Control and Prevention (CDC): www.cdc.gov Summary  Exercising regularly is important. It will improve your overall fitness, flexibility, and endurance.  Regular exercise also will  improve your overall health. It can help you control your weight, reduce stress, and improve your bone density.  Do not exercise so much that you hurt yourself, feel dizzy, or get very short of breath.  Before you start a new exercise program, talk with your health care provider. This information is not intended to replace advice given to you by your health care provider. Make sure you discuss any questions you have with your health care provider. Document Released: 10/12/2010 Document Revised: 08/22/2017 Document Reviewed: 07/31/2017 Elsevier Patient Education  2020 [employer]  

## 2019-06-09 LAB — CMP14+EGFR
ALT: 34 IU/L (ref 0–44)
AST: 30 IU/L (ref 0–40)
Albumin/Globulin Ratio: 2.1 (ref 1.2–2.2)
Albumin: 4.2 g/dL (ref 3.8–4.9)
Alkaline Phosphatase: 78 IU/L (ref 39–117)
BUN/Creatinine Ratio: 9 (ref 9–20)
BUN: 11 mg/dL (ref 6–24)
Bilirubin Total: 0.6 mg/dL (ref 0.0–1.2)
CO2: 25 mmol/L (ref 20–29)
Calcium: 9.3 mg/dL (ref 8.7–10.2)
Chloride: 103 mmol/L (ref 96–106)
Creatinine, Ser: 1.19 mg/dL (ref 0.76–1.27)
GFR calc Af Amer: 79 mL/min/{1.73_m2} (ref >59)
GFR calc non Af Amer: 68 mL/min/{1.73_m2} (ref >59)
Globulin, Total: 2 g/dL (ref 1.5–4.5)
Glucose: 103 mg/dL — ABNORMAL HIGH (ref 65–99)
Potassium: 4.5 mmol/L (ref 3.5–5.2)
Sodium: 139 mmol/L (ref 134–144)
Total Protein: 6.2 g/dL (ref 6.0–8.5)

## 2019-06-09 LAB — LIPID PANEL
Chol/HDL Ratio: 2.7 ratio (ref 0.0–5.0)
Cholesterol, Total: 102 mg/dL (ref 100–199)
HDL: 38 mg/dL — ABNORMAL LOW (ref >39)
LDL Chol Calc (NIH): 52 mg/dL (ref 0–99)
Triglycerides: 49 mg/dL (ref 0–149)
VLDL Cholesterol Cal: 12 mg/dL (ref 5–40)

## 2019-06-09 LAB — PSA, TOTAL AND FREE
PSA, Free Pct: 23.3 %
PSA, Free: 0.14 ng/mL
Prostate Specific Ag, Serum: 0.6 ng/mL (ref 0.0–4.0)

## 2019-10-22 ENCOUNTER — Other Ambulatory Visit: Payer: Self-pay

## 2019-10-22 ENCOUNTER — Encounter: Payer: Self-pay | Admitting: Nurse Practitioner

## 2019-10-22 ENCOUNTER — Ambulatory Visit (INDEPENDENT_AMBULATORY_CARE_PROVIDER_SITE_OTHER): Payer: 59 | Admitting: Nurse Practitioner

## 2019-10-22 VITALS — BP 109/75 | HR 78 | Temp 96.8°F | Resp 20 | Ht 74.0 in | Wt 280.0 lb

## 2019-10-22 DIAGNOSIS — Z0000 Encounter for general adult medical examination without abnormal findings: Secondary | ICD-10-CM | POA: Diagnosis not present

## 2019-10-22 DIAGNOSIS — I10 Essential (primary) hypertension: Secondary | ICD-10-CM

## 2019-10-22 DIAGNOSIS — I2510 Atherosclerotic heart disease of native coronary artery without angina pectoris: Secondary | ICD-10-CM | POA: Diagnosis not present

## 2019-10-22 DIAGNOSIS — I214 Non-ST elevation (NSTEMI) myocardial infarction: Secondary | ICD-10-CM

## 2019-10-22 DIAGNOSIS — Z6835 Body mass index (BMI) 35.0-35.9, adult: Secondary | ICD-10-CM

## 2019-10-22 DIAGNOSIS — Z720 Tobacco use: Secondary | ICD-10-CM | POA: Diagnosis not present

## 2019-10-22 DIAGNOSIS — E7800 Pure hypercholesterolemia, unspecified: Secondary | ICD-10-CM

## 2019-10-22 MED ORDER — METOPROLOL TARTRATE 25 MG PO TABS: 25.0000 mg | ORAL_TABLET | Freq: Two times a day (BID) | ORAL | 1 refills | Status: DC

## 2019-10-22 MED ORDER — ATORVASTATIN CALCIUM 80 MG PO TABS: ORAL_TABLET | ORAL | 1 refills | Status: DC

## 2019-10-22 MED ORDER — TICAGRELOR 90 MG PO TABS: 90.0000 mg | ORAL_TABLET | Freq: Two times a day (BID) | ORAL | 1 refills | Status: DC

## 2019-10-22 NOTE — Patient Instructions (Signed)
Exercising to Stay Healthy To become healthy and stay healthy, it is recommended that you do moderate-intensity and vigorous-intensity exercise. You can tell that you are exercising at a moderate intensity if your heart starts beating faster and you start breathing faster but can still hold a conversation. You can tell that you are exercising at a vigorous intensity if you are breathing much harder and faster and cannot hold a conversation while exercising. Exercising regularly is important. It has many health benefits, such as:  Improving overall fitness, flexibility, and endurance.  Increasing bone density.  Helping with weight control.  Decreasing body fat.  Increasing muscle strength.  Reducing stress and tension.  Improving overall health. How often should I exercise? Choose an activity that you enjoy, and set realistic goals. Your health care provider can help you make an activity plan that works for you. Exercise regularly as told by your health care provider. This may include:  Doing strength training two times a week, such as: ? Lifting weights. ? Using resistance bands. ? Push-ups. ? Sit-ups. ? Yoga.  Doing a certain intensity of exercise for a given amount of time. Choose from these options: ? A total of 150 minutes of moderate-intensity exercise every week. ? A total of 75 minutes of vigorous-intensity exercise every week. ? A mix of moderate-intensity and vigorous-intensity exercise every week. Children, pregnant women, people who have not exercised regularly, people who are overweight, and older adults may need to talk with a health care provider about what activities are safe to do. If you have a medical condition, be sure to talk with your health care provider before you start a new exercise program. What are some exercise ideas? Moderate-intensity exercise ideas include:  Walking 1 mile (1.6 km) in about 15  minutes.  Biking.  Hiking.  Golfing.  Dancing.  Water aerobics. Vigorous-intensity exercise ideas include:  Walking 4.5 miles (7.2 km) or more in about 1 hour.  Jogging or running 5 miles (8 km) in about 1 hour.  Biking 10 miles (16.1 km) or more in about 1 hour.  Lap swimming.  Roller-skating or in-line skating.  Cross-country skiing.  Vigorous competitive sports, such as football, basketball, and soccer.  Jumping rope.  Aerobic dancing. What are some everyday activities that can help me to get exercise?  Yard work, such as: ? Pushing a lawn mower. ? Raking and bagging leaves.  Washing your car.  Pushing a stroller.  Shoveling snow.  Gardening.  Washing windows or floors. How can I be more active in my day-to-day activities?  Use stairs instead of an elevator.  Take a walk during your lunch break.  If you drive, park your car farther away from your work or school.  If you take public transportation, get off one stop early and walk the rest of the way.  Stand up or walk around during all of your indoor phone calls.  Get up, stretch, and walk around every 30 minutes throughout the day.  Enjoy exercise with a friend. Support to continue exercising will help you keep a regular routine of activity. What guidelines can I follow while exercising?  Before you start a new exercise program, talk with your health care provider.  Do not exercise so much that you hurt yourself, feel dizzy, or get very short of breath.  Wear comfortable clothes and wear shoes with good support.  Drink plenty of water while you exercise to prevent dehydration or heat stroke.  Work out until your breathing   and your heartbeat get faster. Where to find more information  U.S. Department of Health and Human Services: www.hhs.gov  Centers for Disease Control and Prevention (CDC): www.cdc.gov Summary  Exercising regularly is important. It will improve your overall fitness,  flexibility, and endurance.  Regular exercise also will improve your overall health. It can help you control your weight, reduce stress, and improve your bone density.  Do not exercise so much that you hurt yourself, feel dizzy, or get very short of breath.  Before you start a new exercise program, talk with your health care provider. This information is not intended to replace advice given to you by your health care provider. Make sure you discuss any questions you have with your health care provider. Document Revised: 08/22/2017 Document Reviewed: 07/31/2017 Elsevier Patient Education  2020 Elsevier Inc.  

## 2019-10-22 NOTE — Progress Notes (Signed)
Subjective:    Patient ID: Cameron [name], male    DOB: [birth year], 54 y.o.   MRN: [id number]   Chief Complaint: Medical Management of Chronic Issues    HPI:  1. Annual physical exam Presents for physical today with chronic medical conditions. No new issues,  2. Essential hypertension No chest pain, SOB, headache, dizziness. Does not check BP at home. BP Readings from Last 3 Encounters:  10/22/19 109/75  06/08/19 113/75  04/02/19 112/80      3. Coronary artery disease involving native coronary artery of native heart without angina pectoris Not really watching diet. Says he does not eat a lot of fat because he does not eat red meat.  4. Tobacco abuse Former smoker. Does use dip. Has tried to cut back.  5. Pure hypercholesterolemia Tries to stay away from high cholesterol foods.  Lab Results  Component Value Date   CHOL 102 06/08/2019   HDL 38 (L) 06/08/2019   LDLCALC 52 06/08/2019   TRIG 49 06/08/2019   CHOLHDL 2.7 06/08/2019     6. BMI 35.0-35.9,adult No significant weight changes. Exercises 4 days a week-cardio and weight bearing. BMI Readings from Last 3 Encounters:  10/22/19 35.95 kg/m  06/08/19 34.92 kg/m  04/02/19 36.05 kg/m   Wt Readings from Last 3 Encounters:  10/22/19 280 lb (127 kg)  06/08/19 272 lb (123.4 kg)  04/02/19 280 lb 12.8 oz (127.4 kg)       Outpatient Encounter Medications as of 10/22/2019  Medication Sig  . aspirin 81 MG tablet Take 81 mg by mouth daily.  Marland Kitchen atorvastatin (LIPITOR) 80 MG tablet TAKE 1 TABLET DAILY AT 6PM  . metoprolol tartrate (LOPRESSOR) 25 MG tablet Take 1 tablet (25 mg total) by mouth 2 (two) times daily.  . nitroGLYCERIN (NITROSTAT) 0.4 MG SL tablet Place 1 tablet (0.4 mg total) under the tongue every 5 (five) minutes x 3 doses as needed for chest pain.  . sildenafil (VIAGRA) 50 MG tablet Take 1 tablet (50 mg total) by mouth as needed for erectile dysfunction.  . ticagrelor (BRILINTA) 90 MG TABS tablet Take 1  tablet (90 mg total) by mouth 2 (two) times daily.     Past Surgical History:  Procedure Laterality Date  . CARDIAC CATHETERIZATION N/A 05/08/2016   Procedure: Left Heart Cath and Coronary Angiography;  Surgeon: Nelva Bush, MD;  Location: [facility];  Service: Cardiovascular;  Laterality: N/A;  . CARDIAC CATHETERIZATION N/A 05/08/2016   Procedure: Coronary Stent Intervention;  Surgeon: Nelva Bush, MD;  Location: [facility];  Service: Cardiovascular;  Laterality: N/A;  Mid LAD Ostial/Prox Ramus  . JOINT REPLACEMENT Left 11/12   knee  . KNEE SURGERY      Family History  Problem Relation Age of Onset  . Thyroid disease Mother   . Cancer Mother        moles- removed  . Stroke Father        died in mid 7's during lower ext bypass  . Heart attack Father     New complaints: None  Social history: Lives with wife and 2 kids. Oldest child lives in [place]. Sees her and granddaughter about 3 times a year.  Controlled substance contract: n/a     Review of Systems  Constitutional: Negative for diaphoresis.  Eyes: Negative for pain.  Respiratory: Negative for shortness of breath.   Cardiovascular: Negative for chest pain, palpitations and leg swelling.  Gastrointestinal: Negative for abdominal pain.  Endocrine: Negative  for polydipsia.  Skin: Negative for rash.  Neurological: Negative for dizziness, weakness and headaches.  Hematological: Does not bruise/bleed easily.  All other systems reviewed and are negative.      Objective:   Physical Exam Vitals and nursing note reviewed.  Constitutional:      Appearance: Normal appearance. He is well-developed.  HENT:     Head: Normocephalic.     Nose: Nose normal.  Eyes:     Pupils: Pupils are equal, round, and reactive to light.  Neck:     Thyroid: No thyroid mass or thyromegaly.     Vascular: No carotid bruit or JVD.     Trachea: Phonation normal.  Cardiovascular:     Rate and Rhythm: Normal rate and  regular rhythm.  Pulmonary:     Effort: Pulmonary effort is normal. No respiratory distress.     Breath sounds: Normal breath sounds.  Abdominal:     General: Bowel sounds are normal.     Palpations: Abdomen is soft.     Tenderness: There is no abdominal tenderness.  Musculoskeletal:        General: Normal range of motion.     Cervical back: Normal range of motion and neck supple.  Lymphadenopathy:     Cervical: No cervical adenopathy.  Skin:    General: Skin is warm and dry.  Neurological:     Mental Status: He is alert and oriented to person, place, and time.  Psychiatric:        Behavior: Behavior normal.        Thought Content: Thought content normal.        Judgment: Judgment normal.     BP 109/75   Pulse 78   Temp (!) 96.8 F (36 C) (Temporal)   Resp 20   Ht 6' 2"  (1.88 m)   Wt 280 lb (127 kg)   SpO2 97%   BMI 35.95 kg/m        Assessment & Plan:  Cameron [name] comes in today with chief complaint of Annual Exam   Diagnosis and orders addressed:  1. Annual physical exam - Thyroid Panel With TSH - CBC with Differential/Platelet  2. Essential hypertension Low sodium diet - CMP14+EGFR  3. Coronary artery disease involving native coronary artery of native heart without angina pectoris Keep yearly follow up with cardiology - metoprolol tartrate (LOPRESSOR) 25 MG tablet; Take 1 tablet (25 mg total) by mouth 2 (two) times daily.  Dispense: 180 tablet; Refill: 1  4. Tobacco abuse Encouraged to stop dipping  5. Pure hypercholesterolemia Low fat diet - Lipid panel - atorvastatin (LIPITOR) 80 MG tablet; TAKE 1 TABLET DAILY AT 6PM  Dispense: 90 tablet; Refill: 1  6. BMI 35.0-35.9,adult /..bowel movement   7. NSTEMI (non-ST elevated myocardial infarction) (HCC) - ticagrelor (BRILINTA) 90 MG TABS tablet; Take 1 tablet (90 mg total) by mouth 2 (two) times daily.  Dispense: 180 tablet; Refill: 1   Labs pending Health Maintenance reviewed Diet and  exercise encouraged  Follow up plan: 6 month   [place], FNP

## 2019-10-23 LAB — CMP14+EGFR
ALT: 35 IU/L (ref 0–44)
AST: 26 IU/L (ref 0–40)
Albumin/Globulin Ratio: 2.1 (ref 1.2–2.2)
Albumin: 4.6 g/dL (ref 3.8–4.9)
Alkaline Phosphatase: 83 IU/L (ref 39–117)
BUN/Creatinine Ratio: 14 (ref 9–20)
BUN: 15 mg/dL (ref 6–24)
Bilirubin Total: 0.6 mg/dL (ref 0.0–1.2)
CO2: 25 mmol/L (ref 20–29)
Calcium: 9.8 mg/dL (ref 8.7–10.2)
Chloride: 102 mmol/L (ref 96–106)
Creatinine, Ser: 1.08 mg/dL (ref 0.76–1.27)
GFR calc Af Amer: 89 mL/min/{1.73_m2} (ref >59)
GFR calc non Af Amer: 77 mL/min/{1.73_m2} (ref >59)
Globulin, Total: 2.2 g/dL (ref 1.5–4.5)
Glucose: 100 mg/dL — ABNORMAL HIGH (ref 65–99)
Potassium: 4.6 mmol/L (ref 3.5–5.2)
Sodium: 141 mmol/L (ref 134–144)
Total Protein: 6.8 g/dL (ref 6.0–8.5)

## 2019-10-23 LAB — LIPID PANEL
Chol/HDL Ratio: 3.3 ratio (ref 0.0–5.0)
Cholesterol, Total: 134 mg/dL (ref 100–199)
HDL: 41 mg/dL (ref >39)
LDL Chol Calc (NIH): 62 mg/dL (ref 0–99)
Triglycerides: 185 mg/dL — ABNORMAL HIGH (ref 0–149)
VLDL Cholesterol Cal: 31 mg/dL (ref 5–40)

## 2019-10-23 LAB — CBC WITH DIFFERENTIAL/PLATELET
Basophils Absolute: 0.1 10*3/uL (ref 0.0–0.2)
Basos: 1 %
EOS (ABSOLUTE): 0.2 10*3/uL (ref 0.0–0.4)
Eos: 2 %
Hematocrit: 49.1 % (ref 37.5–51.0)
Hemoglobin: 16.8 g/dL (ref 13.0–17.7)
Immature Grans (Abs): 0 10*3/uL (ref 0.0–0.1)
Immature Granulocytes: 0 %
Lymphocytes Absolute: 2.5 10*3/uL (ref 0.7–3.1)
Lymphs: 31 %
MCH: 32.1 pg (ref 26.6–33.0)
MCHC: 34.2 g/dL (ref 31.5–35.7)
MCV: 94 fL (ref 79–97)
Monocytes Absolute: 0.6 10*3/uL (ref 0.1–0.9)
Monocytes: 7 %
Neutrophils Absolute: 4.8 10*3/uL (ref 1.4–7.0)
Neutrophils: 59 %
Platelets: 252 10*3/uL (ref 150–450)
RBC: 5.23 x10E6/uL (ref 4.14–5.80)
RDW: 12.4 % (ref 11.6–15.4)
WBC: 8.2 10*3/uL (ref 3.4–10.8)

## 2019-10-23 LAB — THYROID PANEL WITH TSH
Free Thyroxine Index: 1.5 (ref 1.2–4.9)
T3 Uptake Ratio: 25 % (ref 24–39)
T4, Total: 6.1 ug/dL (ref 4.5–12.0)
TSH: 1.37 u[IU]/mL (ref 0.450–4.500)

## 2019-12-03 ENCOUNTER — Encounter: Payer: Self-pay | Admitting: Nurse Practitioner

## 2019-12-06 ENCOUNTER — Ambulatory Visit: Payer: Self-pay | Admitting: Nurse Practitioner

## 2019-12-08 NOTE — Progress Notes (Deleted)
Cardiology Office Note    Date:  12/08/2019   ID:  Cameron [name], DOB 28-Jun-1966, MRN [id number]  PCP:  Chevis Pretty, FNP  Cardiologist: [name], MD EPS: None  No chief complaint on file.   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 Kandace Parkins August 2017 showed normal LV systolic function EF [id number], normal diastolic function, no significant valvular disease, normal RV, tobacco abuse, HTN, HLD, OA   Last seen by Melina Copa, PA-C 04/02/2019 and was doing well.  Was chewing tobacco and asked to cease.      Past Medical History:  Diagnosis Date  . Aseptic meningitis    a. 06/2003.  Marland Kitchen CAD (coronary artery disease)    a. NSTEMI: [facility] on 05/08/16 which showed severe double vessel CAD with prox-mid LAD 80% thrombotic stenosis s/p PCI/DES, oRI 95% sten s/p PCI/DES.  Marland Kitchen HLD (hyperlipidemia)   . HTN (hypertension)   . Osteoarthritis    a. s/p L Total Knee Arthroplasty.  . Tobacco abuse    a. occasional cigarette - approx 1 pack/month.    Past Surgical History:  Procedure Laterality Date  . CARDIAC CATHETERIZATION N/A 05/08/2016   Procedure: Left Heart Cath and Coronary Angiography;  Surgeon: Nelva Bush, MD;  Location: [facility];  Service: Cardiovascular;  Laterality: N/A;  . CARDIAC CATHETERIZATION N/A 05/08/2016   Procedure: Coronary Stent Intervention;  Surgeon: Nelva Bush, MD;  Location: [facility];  Service: Cardiovascular;  Laterality: N/A;  Mid LAD Ostial/Prox Ramus  . JOINT REPLACEMENT Left 11/12   knee  . KNEE SURGERY      Current Medications: No outpatient medications have been marked as taking for the 12/14/19 encounter (Appointment) with Imogene Burn, PA-C.     Allergies:   Patient has no known allergies.   Social History   Socioeconomic History  . Marital status:  Married    Spouse name: Not on file  . Number of children: Not on file  . Years of education: Not on file  . Highest education level: Not on file  Occupational History  . Occupation: Chemical engineer: [employer]  Tobacco Use  . Smoking status: Former Smoker    Quit date: 01/27/2001    Years since quitting: 18.8  . Smokeless tobacco: Current User    Types: Snuff  . Tobacco comment: smokes about 1 pack of cigarettes/month.  Substance and Sexual Activity  . Alcohol use: Yes    Comment: occasional.  . Drug use: No  . Sexual activity: Yes  Other Topics Concern  . Not on file  Social History Narrative   Lives in [place] with wife and children.  Regularly exercises.   Social Determinants of Health   Financial Resource Strain:   . Difficulty of Paying Living Expenses:   Food Insecurity:   . Worried About Charity fundraiser in the Last Year:   . Arboriculturist in the Last Year:   Transportation Needs:   . Film/video editor (Medical):   Marland Kitchen Lack of Transportation (Non-Medical):   Physical Activity:   . Days of Exercise per Week:   . Minutes of Exercise per Session:   Stress:   . Feeling of Stress :   Social Connections:   . Frequency of Communication with Friends and  Family:   . Frequency of Social Gatherings with Friends and Family:   . Attends Religious Services:   . Active Member of Clubs or Organizations:   . Attends Archivist Meetings:   Marland Kitchen Marital Status:      Family History:  The patient's ***family history includes Cancer in his mother; Heart attack in his father; Stroke in his father; Thyroid disease in his mother.   ROS:   Please see the history of present illness.    ROS All other systems reviewed and are negative.   PHYSICAL EXAM:   VS:  There were no vitals taken for this visit.  Physical Exam  GEN: Well nourished, well developed, in no acute distress  HEENT: normal  Neck: no JVD, carotid bruits, or masses Cardiac:RRR; no  murmurs, rubs, or gallops  Respiratory:  clear to auscultation bilaterally, normal work of breathing GI: soft, nontender, nondistended, + BS Ext: without cyanosis, clubbing, or edema, Good distal pulses bilaterally MS: no deformity or atrophy  Skin: warm and dry, no rash Neuro:  Alert and Oriented x 3, Strength and sensation are intact Psych: euthymic mood, full affect  Wt Readings from Last 3 Encounters:  10/22/19 280 lb (127 kg)  06/08/19 272 lb (123.4 kg)  04/02/19 280 lb 12.8 oz (127.4 kg)      Studies/Labs Reviewed:   EKG:  EKG is*** ordered today.  The ekg ordered today demonstrates ***  Recent Labs: 10/22/2019: ALT 35; BUN 15; Creatinine, Ser 1.08; Hemoglobin 16.8; Platelets 252; Potassium 4.6; Sodium 141; TSH 1.370   Lipid Panel    Component Value Date/Time   CHOL 134 10/22/2019 1304   TRIG 185 (H) 10/22/2019 1304   TRIG 121 11/01/2013 1019   HDL 41 10/22/2019 1304   HDL 41 11/01/2013 1019   CHOLHDL 3.3 10/22/2019 1304   CHOLHDL 4.9 05/09/2016 0039   VLDL 71 (H) 05/09/2016 0039   LDLCALC 62 10/22/2019 1304   LDLCALC 105 (H) 11/01/2013 1019    Additional studies/ records that were reviewed today include:   Procedures 05/08/16 Coronary Stent Intervention  Left Heart Cath and Coronary Angiography  Conclusion     Prox LAD to Mid LAD lesion, 80 %stenosed.  Post intervention, there is a 0% residual stenosis.  A STENT XIENCE ALPINE RX [id number] drug eluting stent was successfully placed.  Ost Ramus to Ramus lesion, 95 %stenosed.  Post intervention, there is a 0% residual stenosis.  A STENT XIENCE ALPINE RX [id number] drug eluting stent was successfully placed.  LV end diastolic pressure is mildly elevated.   1. Severe double vessel CAD 2. NSTEMI/Unstable angina 3. Severe thrombotic stenosis mid LAD 4. Severe stenosis proximal Ramus Intermediate branch 5. Successful PTCA/DES x 1 mid LAD 6. Successful PTCA/DES x 1 proximal Ramus intermediate    Recommendations: Will continue DAPT with ASA and Brilinta. Will start high intensity statin. Echo in am. Will start beta blocker. Continue tirofiban infusion for 6 hours post-cath.        2D ECHO: 05/09/2016 LV EF: 60% -   65% Study Conclusions - Left ventricle: The cavity size was normal. Systolic function was   normal. The estimated ejection fraction was in the range of 60%   to 65%. Wall motion was normal; there were no regional wall   motion abnormalities. Left ventricular diastolic function   parameters were normal. Doppler parameters are consistent with   indeterminate ventricular filling pressure. - Aortic valve: Transvalvular velocity was within the normal range.   There  was no stenosis. There was no regurgitation. - Mitral valve: Transvalvular velocity was within the normal range.   There was no evidence for stenosis. There was trivial   regurgitation. - Right ventricle: The cavity size was normal. Wall thickness was   normal. Systolic function was normal. - Tricuspid valve: There was trivial regurgitation. - Pulmonary arteries: Systolic pressure was within the normal   range. PA peak pressure: 27 mm Hg (S).      ASSESSMENT:    1. Coronary artery disease involving native coronary artery of native heart without angina pectoris   2. Hyperlipidemia, unspecified hyperlipidemia type   3. Essential hypertension   4. Tobacco abuse      PLAN:  In order of problems listed above:  CAD: stable. continue ASA/Brilitna, statin and BB. Plan for long term DAPT per Dr. Angelena Form.    HLD: LDL 63. continue statin.    HTN: BP well controlled today.    Tobacco abuse:           Medication Adjustments/Labs and Tests Ordered: Current medicines are reviewed at length with the patient today.  Concerns regarding medicines are outlined above.  Medication changes, Labs and Tests ordered today are listed in the Patient Instructions below. There are no Patient Instructions on file for  this visit.   Sumner Boast, PA-C  12/08/2019 2:43 PM    [facility] [address], [place], [place]  [address] Phone: [phone]; Fax: [phone]

## 2019-12-14 ENCOUNTER — Ambulatory Visit: Payer: 59 | Admitting: Physician Assistant

## 2019-12-16 ENCOUNTER — Ambulatory Visit: Payer: 59 | Attending: Internal Medicine

## 2019-12-16 DIAGNOSIS — Z23 Encounter for immunization: Secondary | ICD-10-CM

## 2019-12-16 NOTE — Progress Notes (Signed)
   Covid-19 Vaccination Clinic  Name:  Cameron [name]    MRN: [id number] DOB: 1965-10-22  12/16/2019  [name] was observed post Covid-19 immunization for 15 minutes without incident. He was provided with Vaccine Information Sheet and instruction to access the V-Safe system.   [name] was instructed to call 911 with any severe reactions post vaccine: Marland Kitchen Difficulty breathing  . Swelling of face and throat  . A fast heartbeat  . A bad rash all over body  . Dizziness and weakness   Immunizations Administered    Name Date Dose VIS Date Route   Moderna COVID-19 Vaccine 12/16/2019  9:51 AM 0.5 mL 08/24/2019 Intramuscular   Manufacturer: Moderna   Lot: [id number]   [id number]

## 2020-01-11 NOTE — Progress Notes (Deleted)
Cardiology Office Note    Date:  01/11/2020   ID:  Cameron, [name] Feb 08, 1966, MRN [id number]  PCP:  Cameron Pretty, FNP  Cardiologist: Cameron Chandler, MD EPS: None  No chief complaint on file.   History of Present Illness:  Cameron [name] is a 54 y.o. male with history of CAD status post DES to the LAD and RI 04/2016 with plan for long-term DAPT per Dr. Angelena Form, echo 04/2016 normal LVEF 60 to 65% with normal diastolic function.  Hypertension, HLD, tobacco abuse.    Past Medical History:  Diagnosis Date  . Actinic keratosis 03/16/2018  . Aseptic meningitis    a. 06/2003.  Marland Kitchen CAD (coronary artery disease)    a. NSTEMI: [facility] on 05/08/16 which showed severe double vessel CAD with prox-mid LAD 80% thrombotic stenosis s/p PCI/DES, oRI 95% sten s/p PCI/DES.  Marland Kitchen HLD (hyperlipidemia)   . HTN (hypertension)   . Osteoarthritis    a. s/p L Total Knee Arthroplasty.  . Tobacco abuse    a. occasional cigarette - approx 1 pack/month.    Past Surgical History:  Procedure Laterality Date  . CARDIAC CATHETERIZATION N/A 05/08/2016   Procedure: Left Heart Cath and Coronary Angiography;  Surgeon: Nelva Bush, MD;  Location: [place] [facility];  Service: Cardiovascular;  Laterality: N/A;  . CARDIAC CATHETERIZATION N/A 05/08/2016   Procedure: Coronary Stent Intervention;  Surgeon: Nelva Bush, MD;  Location: [place] [facility];  Service: Cardiovascular;  Laterality: N/A;  Mid LAD Ostial/Prox Ramus  . JOINT REPLACEMENT Left 11/12   knee  . KNEE SURGERY      Current Medications: No outpatient medications have been marked as taking for the 01/12/20 encounter (Appointment) with Imogene Burn, PA-C.     Allergies:   Patient has no known allergies.   Social History   Socioeconomic History  . Marital status: Married    Spouse name: Not on file  . Number of children: Not on file  . Years of education: Not on file  . Highest education level: Not on file  Occupational  History  . Occupation: Chemical engineer: [employer]  Tobacco Use  . Smoking status: Former Smoker    Quit date: 01/27/2001    Years since quitting: 18.9  . Smokeless tobacco: Current User    Types: Snuff  . Tobacco comment: smokes about 1 pack of cigarettes/month.  Substance and Sexual Activity  . Alcohol use: Yes    Comment: occasional.  . Drug use: No  . Sexual activity: Yes  Other Topics Concern  . Not on file  Social History Narrative   Lives in [place] with wife and children.  Regularly exercises.   Social Determinants of Health   Financial Resource Strain:   . Difficulty of Paying Living Expenses:   Food Insecurity:   . Worried About Charity fundraiser in the Last Year:   . Arboriculturist in the Last Year:   Transportation Needs:   . Film/video editor (Medical):   Marland Kitchen Lack of Transportation (Non-Medical):   Physical Activity:   . Days of Exercise per Week:   . Minutes of Exercise per Session:   Stress:   . Feeling of Stress :   Social Connections:   . Frequency of Communication with Friends and Family:   . Frequency of Social Gatherings with Friends and Family:   . Attends Religious Services:   . Active Member of Clubs or Organizations:   .  Attends Archivist Meetings:   Marland Kitchen Marital Status:      Family History:  The patient's ***family history includes Cancer in his mother; Heart attack in his father; Stroke in his father; Thyroid disease in his mother.   ROS:   Please see the history of present illness.    ROS All other systems reviewed and are negative.   PHYSICAL EXAM:   VS:  There were no vitals taken for this visit.  Physical Exam  GEN: Well nourished, well developed, in no acute distress  HEENT: normal  Neck: no JVD, carotid bruits, or masses Cardiac:RRR; no murmurs, rubs, or gallops  Respiratory:  clear to auscultation bilaterally, normal work of breathing GI: soft, nontender, nondistended, + BS Ext: without cyanosis,  clubbing, or edema, Good distal pulses bilaterally MS: no deformity or atrophy  Skin: warm and dry, no rash Neuro:  Alert and Oriented x 3, Strength and sensation are intact Psych: euthymic mood, full affect  Wt Readings from Last 3 Encounters:  10/22/19 280 lb (127 kg)  06/08/19 272 lb (123.4 kg)  04/02/19 280 lb 12.8 oz (127.4 kg)      Studies/Labs Reviewed:   EKG:  EKG is*** ordered today.  The ekg ordered today demonstrates ***  Recent Labs: 10/22/2019: ALT 35; BUN 15; Creatinine, Ser 1.08; Hemoglobin 16.8; Platelets 252; Potassium 4.6; Sodium 141; TSH 1.370   Lipid Panel    Component Value Date/Time   CHOL 134 10/22/2019 1304   TRIG 185 (H) 10/22/2019 1304   TRIG 121 11/01/2013 1019   HDL 41 10/22/2019 1304   HDL 41 11/01/2013 1019   CHOLHDL 3.3 10/22/2019 1304   CHOLHDL 4.9 05/09/2016 0039   VLDL 71 (H) 05/09/2016 0039   LDLCALC 62 10/22/2019 1304   LDLCALC 105 (H) 11/01/2013 1019    Additional studies/ records that were reviewed today include:  ***    ASSESSMENT:    1. Coronary artery disease involving native coronary artery of native heart without angina pectoris   2. Essential hypertension   3. Hyperlipidemia, unspecified hyperlipidemia type   4. Tobacco abuse      PLAN:  In order of problems listed above:  CAD status post NSTEMI treated with DES to the LAD and DES to the RI 04/2016 with plans for long-term DAPT per Dr. Angelena Form  Essential hypertension  Hyperlipidemia  Tobacco abuse    Medication Adjustments/Labs and Tests Ordered: Current medicines are reviewed at length with the patient today.  Concerns regarding medicines are outlined above.  Medication changes, Labs and Tests ordered today are listed in the Patient Instructions below. There are no Patient Instructions on file for this visit.   Sumner Boast, PA-C  01/11/2020 3:15 PM    [facility] [place], [place], [place]  [address] Phone: [phone]; Fax: [phone]

## 2020-01-12 ENCOUNTER — Ambulatory Visit: Payer: 59 | Admitting: Physician Assistant

## 2020-01-12 DIAGNOSIS — E785 Hyperlipidemia, unspecified: Secondary | ICD-10-CM

## 2020-01-12 DIAGNOSIS — I2510 Atherosclerotic heart disease of native coronary artery without angina pectoris: Secondary | ICD-10-CM

## 2020-01-12 DIAGNOSIS — I10 Essential (primary) hypertension: Secondary | ICD-10-CM

## 2020-01-12 DIAGNOSIS — Z720 Tobacco use: Secondary | ICD-10-CM

## 2020-01-18 ENCOUNTER — Ambulatory Visit: Payer: 59 | Attending: Internal Medicine

## 2020-01-18 DIAGNOSIS — Z23 Encounter for immunization: Secondary | ICD-10-CM

## 2020-01-18 NOTE — Progress Notes (Signed)
   Covid-19 Vaccination Clinic  Name:  Cameron [name]    MRN: [id number] DOB: 04/03/1966  01/18/2020  Cameron [name] was observed post Covid-19 immunization for 15 minutes without incident. He was provided with Vaccine Information Sheet and instruction to access the V-Safe system.   Cameron [name] was instructed to call 911 with any severe reactions post vaccine: Marland Kitchen Difficulty breathing  . Swelling of face and throat  . A fast heartbeat  . A bad rash all over body  . Dizziness and weakness   Immunizations Administered    Name Date Dose VIS Date Route   Moderna COVID-19 Vaccine 01/18/2020  9:33 AM 0.5 mL 08/2019 Intramuscular   Manufacturer: Moderna   Lot: [id number]   [id number]

## 2020-02-17 NOTE — Progress Notes (Deleted)
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   To recap history, he was admitted to [facility] 04/2016 with a NSTEMI. Cardiac cath with severe stenosis proximal LAD treated with a drug eluting stent and severe stenosis ostial Ramus intermediate branch which was treated with a drug eluting stent. Echo August 2017 showed normal LV systolic function EF [id number], normal diastolic function, no significant valvular disease, normal RV. Prior notes outline that Dr. Angelena Form recommended to continue DAPT with ASA/Brilinta 90mg  BID long term. Last labs personally reviewed from 09/2019 show normal CBC, TSH, LDL 62, Trig 185, K 4.6, Cr 1.08, glucose 100, normal LFTs.  PT landscaping, teaching police chew tob trigs higher in January, previously 82  CAD Essential HTN Hyperlipidemia  Tobacco abuse chew *** delete if none    Past Medical History:  Diagnosis Date  . Actinic keratosis 03/16/2018  . Aseptic meningitis    a. 06/2003.  Marland Kitchen CAD (coronary artery disease)    a. NSTEMI: [facility] on 05/08/16 which showed severe double vessel CAD with prox-mid LAD 80% thrombotic stenosis s/p PCI/DES, oRI 95% sten s/p PCI/DES.  Marland Kitchen HLD (hyperlipidemia)   . HTN (hypertension)   . Osteoarthritis    a. s/p L Total Knee Arthroplasty.  . Tobacco abuse    a. occasional cigarette - approx 1 pack/month.    Past Surgical History:  Procedure Laterality Date  . CARDIAC CATHETERIZATION N/A 05/08/2016   Procedure: Left Heart Cath and Coronary Angiography;  Surgeon: Nelva Bush, MD;  Location: [place] [facility];  Service: Cardiovascular;   Laterality: N/A;  . CARDIAC CATHETERIZATION N/A 05/08/2016   Procedure: Coronary Stent Intervention;  Surgeon: Nelva Bush, MD;  Location: [place] [facility];  Service: Cardiovascular;  Laterality: N/A;  Mid LAD Ostial/Prox Ramus  . JOINT REPLACEMENT Left 11/12   knee  . KNEE SURGERY      Current Medications: No outpatient medications have been marked as taking for the 02/23/20 encounter (Appointment) with Charlie Pitter, PA-C.   ***   Allergies:   Patient has no known allergies.   Social History   Socioeconomic History  . Marital status: Married    Spouse name: Not on file  . Number of children: Not on file  . Years of education: Not on file  . Highest education level: Not on file  Occupational History  . Occupation: Chemical engineer: [employer]  Tobacco Use  . Smoking status: Former Smoker    Quit date: 01/27/2001    Years since quitting: 19.0  . Smokeless tobacco: Current User    Types: Snuff  . Tobacco comment: smokes about 1 pack of cigarettes/month.  Substance and Sexual Activity  . Alcohol use: Yes    Comment: occasional.  . Drug use: No  . Sexual activity: Yes  Other Topics Concern  . Not on file  Social History Narrative   Lives in [place] with wife and children.  Regularly exercises.   Social Determinants of Health   Financial Resource Strain:   . Difficulty of Paying  Living Expenses:   Food Insecurity:   . Worried About Charity fundraiser in the Last Year:   . Arboriculturist in the Last Year:   Transportation Needs:   . Film/video editor (Medical):   Marland Kitchen Lack of Transportation (Non-Medical):   Physical Activity:   . Days of Exercise per Week:   . Minutes of Exercise per Session:   Stress:   . Feeling of Stress :   Social Connections:   . Frequency of Communication with Friends and Family:   . Frequency of Social Gatherings with Friends and Family:   . Attends Religious Services:   . Active Member of Clubs or Organizations:     . Attends Archivist Meetings:   Marland Kitchen Marital Status:      Family History:  The patient's ***family history includes Cancer in his mother; Heart attack in his father; Stroke in his father; Thyroid disease in his mother.  ROS:   Please see the history of present illness. Otherwise, review of systems is positive for ***.  All other systems are reviewed and otherwise negative.    EKGs/Labs/Other Studies Reviewed:    Studies reviewed are outlined and summarized above. Reports included below if pertinent.  2D Echo 2017 - Left ventricle: The cavity size was normal. Systolic function was  normal. The estimated ejection fraction was in the range of 60%  to 65%. Wall motion was normal; there were no regional wall  motion abnormalities. Left ventricular diastolic function  parameters were normal. Doppler parameters are consistent with  indeterminate ventricular filling pressure.  - Aortic valve: Transvalvular velocity was within the normal range.  There was no stenosis. There was no regurgitation.  - Mitral valve: Transvalvular velocity was within the normal range.  There was no evidence for stenosis. There was trivial  regurgitation.  - Right ventricle: The cavity size was normal. Wall thickness was  normal. Systolic function was normal.  - Tricuspid valve: There was trivial regurgitation.  - Pulmonary arteries: Systolic pressure was within the normal  range. PA peak pressure: 27 mm Hg (S).  LHC 2017  Prox LAD to Mid LAD lesion, 80 %stenosed.  Post intervention, there is a 0% residual stenosis.  A STENT XIENCE ALPINE RX [id number] drug eluting stent was successfully placed.  Ost Ramus to Ramus lesion, 95 %stenosed.  Post intervention, there is a 0% residual stenosis.  A STENT XIENCE ALPINE RX [id number] drug eluting stent was successfully placed.  LV end diastolic pressure is mildly elevated.   1. Severe double vessel CAD 2. NSTEMI/Unstable angina 3.  Severe thrombotic stenosis mid LAD 4. Severe stenosis proximal Ramus Intermediate branch 5. Successful PTCA/DES x 1 mid LAD 6. Successful PTCA/DES x 1 proximal Ramus intermediate  Recommendations: Will continue DAPT with ASA and Brilinta. Will start high intensity statin. Echo in am. Will start beta blocker. Continue tirofiban infusion for 6 hours post-cath.    EKG:  EKG is ordered today, personally reviewed, demonstrating ***  Recent Labs: 10/22/2019: ALT 35; BUN 15; Creatinine, Ser 1.08; Hemoglobin 16.8; Platelets 252; Potassium 4.6; Sodium 141; TSH 1.370  Recent Lipid Panel    Component Value Date/Time   CHOL 134 10/22/2019 1304   TRIG 185 (H) 10/22/2019 1304   TRIG 121 11/01/2013 1019   HDL 41 10/22/2019 1304   HDL 41 11/01/2013 1019   CHOLHDL 3.3 10/22/2019 1304   CHOLHDL 4.9 05/09/2016 0039   VLDL 71 (H) 05/09/2016 0039   LDLCALC 62 10/22/2019 1304  LDLCALC 105 (H) 11/01/2013 1019    PHYSICAL EXAM:    VS:  There were no vitals taken for this visit.  BMI: There is no height or weight on file to calculate BMI.  GEN: Well nourished, well developed, in no acute distress HEENT: normocephalic, atraumatic Neck: no JVD, carotid bruits, or masses Cardiac: ***RRR; no murmurs, rubs, or gallops, no edema  Respiratory:  clear to auscultation bilaterally, normal work of breathing GI: soft, nontender, nondistended, + BS MS: no deformity or atrophy Skin: warm and dry, no rash Neuro:  Alert and Oriented x 3, Strength and sensation are intact, follows commands Psych: euthymic mood, full affect  Wt Readings from Last 3 Encounters:  10/22/19 280 lb (127 kg)  06/08/19 272 lb (123.4 kg)  04/02/19 280 lb 12.8 oz (127.4 kg)     ASSESSMENT & PLAN:   1. ***  Disposition: F/u with ***   Medication Adjustments/Labs and Tests Ordered: Current medicines are reviewed at length with the patient today.  Concerns regarding medicines are outlined above. Medication changes, Labs and Tests  ordered today are summarized above and listed in the Patient Instructions accessible in Encounters.   Signed, Charlie Pitter, PA-C  02/17/2020 3:48 PM    [facility] [place], [place], [place]  [address] Phone: [phone]; Fax: [phone]

## 2020-02-23 ENCOUNTER — Ambulatory Visit: Payer: 59 | Admitting: Physician Assistant

## 2020-03-20 NOTE — Progress Notes (Deleted)
Cardiology Office Note    Date:  03/20/2020   ID:  Cameron [name], DOB 02-21-1966, MRN [id number]  PCP:  Chevis Pretty, FNP  Cardiologist: [name], MD EPS: None  No chief complaint on file.   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  Also has hypertension, HLD, tobacco abuse.  Dr. Angelena Form plan for long-term DAPT.  Patient last saw Melina Copa, PA-C 04/02/2019 was doing well.  No Changes made.  Past Medical History:  Diagnosis Date  . Actinic keratosis 03/16/2018  . Aseptic meningitis    a. 06/2003.  Marland Kitchen CAD (coronary artery disease)    a. NSTEMI: [facility] on 05/08/16 which showed severe double vessel CAD with prox-mid LAD 80% thrombotic stenosis s/p PCI/DES, oRI 95% sten s/p PCI/DES.  Marland Kitchen HLD (hyperlipidemia)   . HTN (hypertension)   . Osteoarthritis    a. s/p L Total Knee Arthroplasty.  . Tobacco abuse    a. occasional cigarette - approx 1 pack/month.    Past Surgical History:  Procedure Laterality Date  . CARDIAC CATHETERIZATION N/A 05/08/2016   Procedure: Left Heart Cath and Coronary Angiography;  Surgeon: Nelva Bush, MD;  Location: [place] [facility];  Service: Cardiovascular;  Laterality: N/A;  . CARDIAC CATHETERIZATION N/A 05/08/2016   Procedure: Coronary Stent Intervention;  Surgeon: Nelva Bush, MD;  Location: [place] [facility];  Service: Cardiovascular;  Laterality: N/A;  Mid LAD Ostial/Prox Ramus  . JOINT REPLACEMENT Left 11/12   knee  . KNEE SURGERY      Current Medications: No outpatient medications have been marked as taking for the 03/22/20 encounter (Appointment) with Imogene Burn, PA-C.     Allergies:   Patient has no known allergies.   Social History   Socioeconomic History  . Marital status: Married    Spouse name: Not on file  . Number of children: Not on file  .  Years of education: Not on file  . Highest education level: Not on file  Occupational History  . Occupation: Chemical engineer: [employer]  Tobacco Use  . Smoking status: Former Smoker    Quit date: 01/27/2001    Years since quitting: 19.1  . Smokeless tobacco: Current User    Types: Snuff  . Tobacco comment: smokes about 1 pack of cigarettes/month.  Vaping Use  . Vaping Use: Never used  Substance and Sexual Activity  . Alcohol use: Yes    Comment: occasional.  . Drug use: No  . Sexual activity: Yes  Other Topics Concern  . Not on file  Social History Narrative   Lives in [place] with wife and children.  Regularly exercises.   Social Determinants of Health   Financial Resource Strain:   . Difficulty of Paying Living Expenses:   Food Insecurity:   . Worried About Charity fundraiser in the Last Year:   . Arboriculturist in the Last Year:   Transportation Needs:   . Film/video editor (Medical):   Marland Kitchen Lack of Transportation (Non-Medical):   Physical Activity:   . Days of Exercise per Week:   . Minutes of Exercise per Session:   Stress:   . Feeling of Stress :   Social Connections:   . Frequency of Communication with Friends and Family:   .  Frequency of Social Gatherings with Friends and Family:   . Attends Religious Services:   . Active Member of Clubs or Organizations:   . Attends Archivist Meetings:   Marland Kitchen Marital Status:      Family History:  The patient's ***family history includes Cancer in his mother; Heart attack in his father; Stroke in his father; Thyroid disease in his mother.   ROS:   Please see the history of present illness.    ROS All other systems reviewed and are negative.   PHYSICAL EXAM:   VS:  There were no vitals taken for this visit.  Physical Exam  GEN: Well nourished, well developed, in no acute distress  HEENT: normal  Neck: no JVD, carotid bruits, or masses Cardiac:RRR; no murmurs, rubs, or gallops  Respiratory:   clear to auscultation bilaterally, normal work of breathing GI: soft, nontender, nondistended, + BS Ext: without cyanosis, clubbing, or edema, Good distal pulses bilaterally MS: no deformity or atrophy  Skin: warm and dry, no rash Neuro:  Alert and Oriented x 3, Strength and sensation are intact Psych: euthymic mood, full affect  Wt Readings from Last 3 Encounters:  10/22/19 280 lb (127 kg)  06/08/19 272 lb (123.4 kg)  04/02/19 280 lb 12.8 oz (127.4 kg)      Studies/Labs Reviewed:   EKG:  EKG is*** ordered today.  The ekg ordered today demonstrates ***  Recent Labs: 10/22/2019: ALT 35; BUN 15; Creatinine, Ser 1.08; Hemoglobin 16.8; Platelets 252; Potassium 4.6; Sodium 141; TSH 1.370   Lipid Panel    Component Value Date/Time   CHOL 134 10/22/2019 1304   TRIG 185 (H) 10/22/2019 1304   TRIG 121 11/01/2013 1019   HDL 41 10/22/2019 1304   HDL 41 11/01/2013 1019   CHOLHDL 3.3 10/22/2019 1304   CHOLHDL 4.9 05/09/2016 0039   VLDL 71 (H) 05/09/2016 0039   LDLCALC 62 10/22/2019 1304   LDLCALC 105 (H) 11/01/2013 1019    Additional studies/ records that were reviewed today include: Procedures 05/08/16 Coronary Stent Intervention  Left Heart Cath and Coronary Angiography  Conclusion     Prox LAD to Mid LAD lesion, 80 %stenosed.  Post intervention, there is a 0% residual stenosis.  A STENT XIENCE ALPINE RX 4.0X15 drug eluting stent was successfully placed.  Ost Ramus to Ramus lesion, 95 %stenosed.  Post intervention, there is a 0% residual stenosis.  A STENT XIENCE ALPINE RX [id number] drug eluting stent was successfully placed.  LV end diastolic pressure is mildly elevated.   1. Severe double vessel CAD 2. NSTEMI/Unstable angina 3. Severe thrombotic stenosis mid LAD 4. Severe stenosis proximal Ramus Intermediate branch 5. Successful PTCA/DES x 1 mid LAD 6. Successful PTCA/DES x 1 proximal Ramus intermediate   Recommendations: Will continue DAPT with ASA and Brilinta.  Will start high intensity statin. Echo in am. Will start beta blocker. Continue tirofiban infusion for 6 hours post-cath.        2D ECHO: 05/09/2016 LV EF: 60% -   65% Study Conclusions - Left ventricle: The cavity size was normal. Systolic function was   normal. The estimated ejection fraction was in the range of 60%   to 65%. Wall motion was normal; there were no regional wall   motion abnormalities. Left ventricular diastolic function   parameters were normal. Doppler parameters are consistent with   indeterminate ventricular filling pressure. - Aortic valve: Transvalvular velocity was within the normal range.   There was no stenosis. There was no  regurgitation. - Mitral valve: Transvalvular velocity was within the normal range.   There was no evidence for stenosis. There was trivial   regurgitation. - Right ventricle: The cavity size was normal. Wall thickness was   normal. Systolic function was normal. - Tricuspid valve: There was trivial regurgitation. - Pulmonary arteries: Systolic pressure was within the normal   range. PA peak pressure: 27 mm Hg (S).      ASSESSMENT:    No diagnosis found.   PLAN:  In order of problems listed above:  CAD status post NSTEMI treated with DES to the LAD and RI 04/2016 with normal LVEF 60 to 65% on echo  Essential hypertension  Hyperlipidemia  Tobacco abuse quit smoking but chews tobacco    Medication Adjustments/Labs and Tests Ordered: Current medicines are reviewed at length with the patient today.  Concerns regarding medicines are outlined above.  Medication changes, Labs and Tests ordered today are listed in the Patient Instructions below. There are no Patient Instructions on file for this visit.   Sumner Boast, PA-C  03/20/2020 3:09 PM    [facility] [place], [place], [place]  [address] Phone: [phone]; Fax: [phone]

## 2020-03-22 ENCOUNTER — Ambulatory Visit: Payer: 59 | Admitting: Physician Assistant

## 2020-07-04 ENCOUNTER — Telehealth: Payer: Self-pay | Admitting: Cardiovascular Disease

## 2020-07-04 NOTE — Telephone Encounter (Signed)
Patient schedule message:  "Due for my check up and want to discuss some numbness in fingers and lightheaded at times"

## 2020-07-04 NOTE — Telephone Encounter (Signed)
Left message for patient to call back  

## 2020-07-13 NOTE — Telephone Encounter (Signed)
I spoke w patient.  Appointment with Dr. Angelena Form scheduled.

## 2020-07-18 ENCOUNTER — Encounter: Payer: Self-pay | Admitting: Cardiovascular Disease

## 2020-07-18 ENCOUNTER — Ambulatory Visit: Payer: 59 | Admitting: Cardiovascular Disease

## 2020-07-18 ENCOUNTER — Other Ambulatory Visit: Payer: Self-pay

## 2020-07-18 VITALS — BP 114/76 | HR 63 | Ht 74.0 in | Wt 278.8 lb

## 2020-07-18 DIAGNOSIS — I2510 Atherosclerotic heart disease of native coronary artery without angina pectoris: Secondary | ICD-10-CM | POA: Diagnosis not present

## 2020-07-18 DIAGNOSIS — E785 Hyperlipidemia, unspecified: Secondary | ICD-10-CM

## 2020-07-18 DIAGNOSIS — I10 Essential (primary) hypertension: Secondary | ICD-10-CM | POA: Diagnosis not present

## 2020-07-18 MED ORDER — CLOPIDOGREL BISULFATE 75 MG PO TABS: 75.0000 mg | ORAL_TABLET | Freq: Every day | ORAL | 3 refills | Status: DC

## 2020-07-18 NOTE — Progress Notes (Signed)
Chief Complaint  Patient presents with  . Follow-up    CAD    History of Present Illness: 54 yo male with history of CAD, HTN, HLD, tobacco use who is here today for cardiac follow up. He was admitted to [facility] August 2017 with a NSTEMI. Cardiac cath with severe stenosis proximal LAD treated with a drug eluting stent and severe stenosis ostial Ramus intermediate branch which was treated with a drug eluting stent. Echo August 2017 with normal LV systolic function, no valve disese.   He is here today for follow up. The patient denies any chest pain, dyspnea, palpitations, lower extremity edema, orthopnea, PND, dizziness, near syncope or syncope. Some tingling in fingers at times.   Primary Care Physician: Chevis Pretty, FNP  Past Medical History:  Diagnosis Date  . Actinic keratosis 03/16/2018  . Aseptic meningitis    a. 06/2003.  Marland Kitchen CAD (coronary artery disease)    a. NSTEMI: [facility] on 05/08/16 which showed severe double vessel CAD with prox-mid LAD 80% thrombotic stenosis s/p PCI/DES, oRI 95% sten s/p PCI/DES.  Marland Kitchen HLD (hyperlipidemia)   . HTN (hypertension)   . Osteoarthritis    a. s/p L Total Knee Arthroplasty.  . Tobacco abuse    a. occasional cigarette - approx 1 pack/month.    Past Surgical History:  Procedure Laterality Date  . CARDIAC CATHETERIZATION N/A 05/08/2016   Procedure: Left Heart Cath and Coronary Angiography;  Surgeon: Nelva Bush, MD;  Location: [place] [facility];  Service: Cardiovascular;  Laterality: N/A;  . CARDIAC CATHETERIZATION N/A 05/08/2016   Procedure: Coronary Stent Intervention;  Surgeon: Nelva Bush, MD;  Location: [place] [facility];  Service: Cardiovascular;  Laterality: N/A;  Mid LAD Ostial/Prox Ramus  . JOINT REPLACEMENT Left 11/12   knee  . KNEE SURGERY      Current Outpatient Medications  Medication Sig Dispense Refill  . aspirin 81 MG tablet Take 81 mg by mouth daily.    Marland Kitchen atorvastatin (LIPITOR) 80 MG tablet TAKE 1 TABLET DAILY AT  6PM 90 tablet 1  . cyclobenzaprine (FLEXERIL) 10 MG tablet Take 10 mg by mouth as needed for muscle spasms.    . metoprolol tartrate (LOPRESSOR) 25 MG tablet Take 1 tablet (25 mg total) by mouth 2 (two) times daily. 180 tablet 1  . nitroGLYCERIN (NITROSTAT) 0.4 MG SL tablet Place 1 tablet (0.4 mg total) under the tongue every 5 (five) minutes x 3 doses as needed for chest pain. 25 tablet 12  . sildenafil (VIAGRA) 50 MG tablet Take 1 tablet (50 mg total) by mouth as needed for erectile dysfunction. 20 tablet 1  . clopidogrel (PLAVIX) 75 MG tablet Take 1 tablet (75 mg total) by mouth daily. 90 tablet 3   No current facility-administered medications for this visit.    No Known Allergies  Social History   Socioeconomic History  . Marital status: Married    Spouse name: Not on file  . Number of children: Not on file  . Years of education: Not on file  . Highest education level: Not on file  Occupational History  . Occupation: Chemical engineer: [employer]  Tobacco Use  . Smoking status: Former Smoker    Quit date: 01/27/2001    Years since quitting: 19.4  . Smokeless tobacco: Current User    Types: Snuff  . Tobacco comment: smokes about 1 pack of cigarettes/month.  Vaping Use  . Vaping Use: Never used  Substance and Sexual Activity  .  Alcohol use: Yes    Comment: occasional.  . Drug use: No  . Sexual activity: Yes  Other Topics Concern  . Not on file  Social History Narrative   Lives in [place] with wife and children.  Regularly exercises.   Social Determinants of Health   Financial Resource Strain:   . Difficulty of Paying Living Expenses: Not on file  Food Insecurity:   . Worried About Charity fundraiser in the Last Year: Not on file  . Ran Out of Food in the Last Year: Not on file  Transportation Needs:   . Lack of Transportation (Medical): Not on file  . Lack of Transportation (Non-Medical): Not on file  Physical Activity:   . Days of Exercise per Week:  Not on file  . Minutes of Exercise per Session: Not on file  Stress:   . Feeling of Stress : Not on file  Social Connections:   . Frequency of Communication with Friends and Family: Not on file  . Frequency of Social Gatherings with Friends and Family: Not on file  . Attends Religious Services: Not on file  . Active Member of Clubs or Organizations: Not on file  . Attends Archivist Meetings: Not on file  . Marital Status: Not on file  Intimate Partner Violence:   . Fear of Current or Ex-Partner: Not on file  . Emotionally Abused: Not on file  . Physically Abused: Not on file  . Sexually Abused: Not on file    Family History  Problem Relation Age of Onset  . Thyroid disease Mother   . Cancer Mother        moles- removed  . Stroke Father        died in mid 35's during lower ext bypass  . Heart attack Father     Review of Systems:  As stated in the HPI and otherwise negative.   BP 114/76   Pulse 63   Ht 6\' 2"  (1.88 m)   Wt 278 lb 12.8 oz (126.5 kg)   SpO2 97%   BMI 35.80 kg/m   Physical Examination:  General: Well developed, well nourished, NAD  HEENT: OP clear, mucus membranes moist  SKIN: warm, dry. No rashes. Neuro: No focal deficits  Musculoskeletal: Muscle strength 5/5 all ext  Psychiatric: Mood and affect normal  Neck: No JVD, no carotid bruits, no thyromegaly, no lymphadenopathy.  Lungs:Clear bilaterally, no wheezes, rhonci, crackles Cardiovascular: Regular rate and rhythm. No murmurs, gallops or rubs. Abdomen:Soft. Bowel sounds present. Non-tender.  Extremities: No lower extremity edema. Pulses are 2 + in the bilateral DP/PT.  Echo August 2017: Left ventricle: The cavity size was normal. Systolic function was   normal. The estimated ejection fraction was in the range of 60%   to 65%. Wall motion was normal; there were no regional wall   motion abnormalities. Left ventricular diastolic function   parameters were normal. Doppler parameters are  consistent with   indeterminate ventricular filling pressure. - Aortic valve: Transvalvular velocity was within the normal range.   There was no stenosis. There was no regurgitation. - Mitral valve: Transvalvular velocity was within the normal range.   There was no evidence for stenosis. There was trivial   regurgitation. - Right ventricle: The cavity size was normal. Wall thickness was   normal. Systolic function was normal. - Tricuspid valve: There was trivial regurgitation. - Pulmonary arteries: Systolic pressure was within the normal   range. PA peak pressure: 27 mm Hg (  S).  EKG:  EKG is ordered today. The ekg ordered today demonstrates sinus  Recent Labs: 10/22/2019: ALT 35; BUN 15; Creatinine, Ser 1.08; Hemoglobin 16.8; Platelets 252; Potassium 4.6; Sodium 141; TSH 1.370   Lipid Panel    Component Value Date/Time   CHOL 134 10/22/2019 1304   TRIG 185 (H) 10/22/2019 1304   TRIG 121 11/01/2013 1019   HDL 41 10/22/2019 1304   HDL 41 11/01/2013 1019   CHOLHDL 3.3 10/22/2019 1304   CHOLHDL 4.9 05/09/2016 0039   VLDL 71 (H) 05/09/2016 0039   LDLCALC 62 10/22/2019 1304   LDLCALC 105 (H) 11/01/2013 1019     Wt Readings from Last 3 Encounters:  07/18/20 278 lb 12.8 oz (126.5 kg)  10/22/19 280 lb (127 kg)  06/08/19 272 lb (123.4 kg)     Other studies Reviewed: Additional studies/ records that were reviewed today include: . Review of the above records demonstrates:   Assessment and Plan:  1. CAD: He presented with a NSTEMI in August 2017 and had a drug eluting stent placed in the proximal to mid LAD and a drug eluting stent placed in the ostial Ramus intermediate branch. We have continued DAPT. Will change Brilinta to Plavix. Continue ASA, statin and beta blocker.  Echo now to assess LVEF  2. HTN: BP well controlled. Continue current therapy  3. Hyperlipidemia: LDL at goal in January 2021. Continue statin  Current medicines are reviewed at length with the patient today.   The patient does not have concerns regarding medicines.  The following changes have been made:  no change  Labs/ tests ordered today include:   Orders Placed This Encounter  Procedures  . EKG 12-Lead  . ECHOCARDIOGRAM COMPLETE   Disposition:   FU with me in 12 months  Signed, [name], MD 07/18/2020 2:29 PM    [address], [place], [place]  [address] Phone: [phone]; Fax: [phone]

## 2020-07-18 NOTE — Patient Instructions (Signed)
Medication Instructions:  Your physician has recommended you make the following change in your medication:  1.) stop Brilinta 2.) start Plavix (clopidogrel) 75 mg --one tablet once daily  *If you need a refill on your cardiac medications before your next appointment, please call your pharmacy*   Lab Work: none If you have labs (blood work) drawn today and your tests are completely normal, you will receive your results only by: Marland Kitchen MyChart Message (if you have MyChart) OR . A paper copy in the mail If you have any lab test that is abnormal or we need to change your treatment, we will call you to review the results.   Testing/Procedures: Your physician has requested that you have an echocardiogram. Echocardiography is a painless test that uses sound waves to create images of your heart. It provides your doctor with information about the size and shape of your heart and how well your heart's chambers and valves are working. This procedure takes approximately one hour. There are no restrictions for this procedure.   Follow-Up: At [facility], you and your health needs are our priority.  As part of our continuing mission to provide you with exceptional heart care, we have created designated Provider Care Teams.  These Care Teams include your primary Cardiologist (physician) and Advanced Practice Providers (APPs -  Physician Assistants and Nurse Practitioners) who all work together to provide you with the care you need, when you need it.     Your next appointment:   12 month(s)  The format for your next appointment:   In Person  Provider:   You may see [name], MD or one of the following Advanced Practice Providers on your designated Care Team:    Melina Copa, PA-C  Ermalinda Barrios, PA-C   Other Instructions

## 2020-07-25 ENCOUNTER — Ambulatory Visit: Payer: 59 | Admitting: Physician Assistant

## 2020-08-07 ENCOUNTER — Ambulatory Visit (HOSPITAL_COMMUNITY): Payer: 59 | Attending: Cardiology

## 2020-08-07 ENCOUNTER — Other Ambulatory Visit: Payer: Self-pay

## 2020-08-07 DIAGNOSIS — E785 Hyperlipidemia, unspecified: Secondary | ICD-10-CM | POA: Diagnosis not present

## 2020-08-07 DIAGNOSIS — I10 Essential (primary) hypertension: Secondary | ICD-10-CM

## 2020-08-07 DIAGNOSIS — I2510 Atherosclerotic heart disease of native coronary artery without angina pectoris: Secondary | ICD-10-CM | POA: Insufficient documentation

## 2020-08-07 LAB — ECHOCARDIOGRAM COMPLETE
Area-P 1/2: 2.5 cm2
S' Lateral: 3.4 cm

## 2020-08-15 ENCOUNTER — Telehealth: Payer: 59 | Admitting: Emergency Medicine

## 2020-08-15 DIAGNOSIS — J019 Acute sinusitis, unspecified: Secondary | ICD-10-CM | POA: Diagnosis not present

## 2020-08-15 MED ORDER — AMOXICILLIN-POT CLAVULANATE 875-125 MG PO TABS: 1.0000 | ORAL_TABLET | Freq: Two times a day (BID) | ORAL | 0 refills | Status: DC

## 2020-08-15 NOTE — Progress Notes (Signed)
Time spent: 10 min  We are sorry that you are not feeling well.  Here is how we plan to help!  Based on what you have shared with me it looks like you have sinusitis.  Sinusitis is inflammation and infection in the sinus cavities of the head.  Based on your presentation I believe you most likely have Acute Bacterial Sinusitis.  This is an infection caused by bacteria and is treated with antibiotics. I have prescribed Augmentin 875mg /125mg  one tablet twice daily with food, for 7 days. You may use an oral decongestant such as Mucinex D or if you have glaucoma or high blood pressure use plain Mucinex. Saline nasal spray help and can safely be used as often as needed for congestion.  If you develop worsening sinus pain, fever or notice severe headache and vision changes, or if symptoms are not better after completion of antibiotic, please schedule an appointment with a health care provider.    Sinus infections are not as easily transmitted as other respiratory infection, however we still recommend that you avoid close contact with loved ones, especially the very young and elderly.  Remember to wash your hands thoroughly throughout the day as this is the number one way to prevent the spread of infection!  Home Care:  Only take medications as instructed by your medical team.  Complete the entire course of an antibiotic.  Do not take these medications with alcohol.  A steam or ultrasonic humidifier can help congestion.  You can place a towel over your head and breathe in the steam from hot water coming from a faucet.  Avoid close contacts especially the very young and the elderly.  Cover your mouth when you cough or sneeze.  Always remember to wash your hands.  Get Help Right Away If:  You develop worsening fever or sinus pain.  You develop a severe head ache or visual changes.  Your symptoms persist after you have completed your treatment plan.  Make sure you  Understand these  instructions.  Will watch your condition.  Will get help right away if you are not doing well or get worse.  Your e-visit answers were reviewed by a board certified advanced clinical practitioner to complete your personal care plan.  Depending on the condition, your plan could have included both over the counter or prescription medications.  If there is a problem please reply  once you have received a response from your provider.  Your safety is important to [place].  If you have drug allergies check your prescription carefully.    You can use MyChart to ask questions about today's visit, request a non-urgent call back, or ask for a work or school excuse for 24 hours related to this e-Visit. If it has been greater than 24 hours you will need to follow up with your provider, or enter a new e-Visit to address those concerns.  You will get an e-mail in the next two days asking about your experience.  I hope that your e-visit has been valuable and will speed your recovery. Thank you for using e-visits.

## 2020-10-24 ENCOUNTER — Other Ambulatory Visit: Payer: Self-pay | Admitting: Nurse Practitioner

## 2020-10-24 DIAGNOSIS — E7800 Pure hypercholesterolemia, unspecified: Secondary | ICD-10-CM

## 2020-10-24 DIAGNOSIS — I2510 Atherosclerotic heart disease of native coronary artery without angina pectoris: Secondary | ICD-10-CM

## 2020-11-30 ENCOUNTER — Ambulatory Visit: Payer: 59 | Admitting: Nurse Practitioner

## 2020-12-07 ENCOUNTER — Other Ambulatory Visit: Payer: Self-pay | Admitting: Nurse Practitioner

## 2020-12-07 DIAGNOSIS — I2510 Atherosclerotic heart disease of native coronary artery without angina pectoris: Secondary | ICD-10-CM

## 2020-12-11 ENCOUNTER — Ambulatory Visit (INDEPENDENT_AMBULATORY_CARE_PROVIDER_SITE_OTHER): Payer: 59 | Admitting: Nurse Practitioner

## 2020-12-11 ENCOUNTER — Encounter: Payer: Self-pay | Admitting: Nurse Practitioner

## 2020-12-11 ENCOUNTER — Other Ambulatory Visit: Payer: Self-pay

## 2020-12-11 VITALS — BP 113/74 | HR 71 | Temp 97.5°F | Resp 20 | Ht 74.0 in | Wt 287.0 lb

## 2020-12-11 DIAGNOSIS — I2510 Atherosclerotic heart disease of native coronary artery without angina pectoris: Secondary | ICD-10-CM | POA: Diagnosis not present

## 2020-12-11 DIAGNOSIS — I10 Essential (primary) hypertension: Secondary | ICD-10-CM

## 2020-12-11 DIAGNOSIS — Z6835 Body mass index (BMI) 35.0-35.9, adult: Secondary | ICD-10-CM

## 2020-12-11 DIAGNOSIS — Z720 Tobacco use: Secondary | ICD-10-CM

## 2020-12-11 DIAGNOSIS — Z0001 Encounter for general adult medical examination with abnormal findings: Secondary | ICD-10-CM | POA: Diagnosis not present

## 2020-12-11 DIAGNOSIS — Z0000 Encounter for general adult medical examination without abnormal findings: Secondary | ICD-10-CM

## 2020-12-11 DIAGNOSIS — E7800 Pure hypercholesterolemia, unspecified: Secondary | ICD-10-CM | POA: Diagnosis not present

## 2020-12-11 DIAGNOSIS — M545 Low back pain, unspecified: Secondary | ICD-10-CM

## 2020-12-11 DIAGNOSIS — Z1211 Encounter for screening for malignant neoplasm of colon: Secondary | ICD-10-CM

## 2020-12-11 DIAGNOSIS — Z125 Encounter for screening for malignant neoplasm of prostate: Secondary | ICD-10-CM

## 2020-12-11 MED ORDER — METOPROLOL TARTRATE 25 MG PO TABS: 25.0000 mg | ORAL_TABLET | Freq: Two times a day (BID) | ORAL | 1 refills | Status: DC

## 2020-12-11 MED ORDER — ATORVASTATIN CALCIUM 80 MG PO TABS: ORAL_TABLET | ORAL | 1 refills | Status: DC

## 2020-12-11 MED ORDER — CYCLOBENZAPRINE HCL 10 MG PO TABS: 10.0000 mg | ORAL_TABLET | ORAL | 2 refills | Status: DC | PRN

## 2020-12-11 NOTE — Addendum Note (Signed)
Addended by: Chevis Pretty on: 12/11/2020 11:39 AM   Modules accepted: Orders

## 2020-12-11 NOTE — Progress Notes (Signed)
Subjective:    Patient ID: Cameron [name], male    DOB: January 01, 1966, 55 y.o.   MRN: [id number]   Chief Complaint: Annual Exam    HPI:  1. Annual physical exam  2. Primary hypertension No c/o chest pain, sob or headache. Does not check blood pressure at home. BP Readings from Last 3 Encounters:  12/11/20 113/74  07/18/20 114/76  10/22/19 109/75     3. Pure hypercholesterolemia Is on lipitor daily and is doing well. Does try to watchdiet and works out several times a week. Lab Results  Component Value Date   CHOL 134 10/22/2019   HDL 41 10/22/2019   LDLCALC 62 10/22/2019   TRIG 185 (H) 10/22/2019   CHOLHDL 3.3 10/22/2019   4. Coronary artery disease involving native coronary artery of native heart without angina pectoris Has been doing well since his nonstemi heart attack and bypass surgery. He is doing well on metoprolol. Last saw cardiology 07/18/20. According to office note, no changes were made to plan of care.  5. Tobacco abuse Dips.  6. BMI 35.0-35.9,adult No recent weight changes Wt Readings from Last 3 Encounters:  12/11/20 287 lb (130.2 kg)  07/18/20 278 lb 12.8 oz (126.5 kg)  10/22/19 280 lb (127 kg)   BMI Readings from Last 3 Encounters:  12/11/20 36.85 kg/m  07/18/20 35.80 kg/m  10/22/19 35.95 kg/m       Outpatient Encounter Medications as of 12/11/2020  Medication Sig  . amoxicillin-clavulanate (AUGMENTIN) 875-125 MG tablet Take 1 tablet by mouth 2 (two) times daily.  Marland Kitchen aspirin 81 MG tablet Take 81 mg by mouth daily.  Marland Kitchen atorvastatin (LIPITOR) 80 MG tablet TAKE 1 TABLET DAILY AT 6PM  . clopidogrel (PLAVIX) 75 MG tablet Take 1 tablet (75 mg total) by mouth daily.  . cyclobenzaprine (FLEXERIL) 10 MG tablet Take 10 mg by mouth as needed for muscle spasms.  . metoprolol tartrate (LOPRESSOR) 25 MG tablet TAKE ONE TABLET BY MOUTH TWICE DAILY  . nitroGLYCERIN (NITROSTAT) 0.4 MG SL tablet Place 1 tablet (0.4 mg total) under the tongue every 5 (five)  minutes x 3 doses as needed for chest pain.  . sildenafil (VIAGRA) 50 MG tablet Take 1 tablet (50 mg total) by mouth as needed for erectile dysfunction.   No facility-administered encounter medications on file as of 12/11/2020.    Past Surgical History:  Procedure Laterality Date  . CARDIAC CATHETERIZATION N/A 05/08/2016   Procedure: Left Heart Cath and Coronary Angiography;  Surgeon: Nelva Bush, MD;  Location: [place];  Service: Cardiovascular;  Laterality: N/A;  . CARDIAC CATHETERIZATION N/A 05/08/2016   Procedure: Coronary Stent Intervention;  Surgeon: Nelva Bush, MD;  Location: [place] [facility];  Service: Cardiovascular;  Laterality: N/A;  Mid LAD Ostial/Prox Ramus  . JOINT REPLACEMENT Left 11/12   knee  . KNEE SURGERY      Family History  Problem Relation Age of Onset  . Thyroid disease Mother   . Cancer Mother        moles- removed  . Stroke Father        died in mid 84's during lower ext bypass  . 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  Eyes: Negative for pain.  Respiratory: Negative for shortness of  breath.   Cardiovascular: Negative for chest pain, palpitations and leg swelling.  Gastrointestinal: Negative for abdominal pain.  Endocrine: Negative for polydipsia.  Skin: Negative for rash.  Neurological: Negative for dizziness, weakness and headaches.  Hematological: Does not bruise/bleed easily.  All other systems reviewed and are negative.      Objective:   Physical Exam Vitals and nursing note reviewed.  Constitutional:      Appearance: Normal appearance. He is well-developed.  HENT:     Head: Normocephalic.     Nose: Nose normal.  Eyes:     Pupils: Pupils are equal, round, and reactive to light.  Neck:     Thyroid: No  thyroid mass or thyromegaly.     Vascular: No carotid bruit or JVD.     Trachea: Phonation normal.  Cardiovascular:     Rate and Rhythm: Normal rate and regular rhythm.  Pulmonary:     Effort: Pulmonary effort is normal. No respiratory distress.     Breath sounds: Normal breath sounds.  Abdominal:     General: Bowel sounds are normal.     Palpations: Abdomen is soft.     Tenderness: There is no abdominal tenderness.  Musculoskeletal:        General: Normal range of motion.     Cervical back: Normal range of motion and neck supple.  Lymphadenopathy:     Cervical: No cervical adenopathy.  Skin:    General: Skin is warm and dry.  Neurological:     Mental Status: He is alert and oriented to person, place, and time.  Psychiatric:        Behavior: Behavior normal.        Thought Content: Thought content normal.        Judgment: Judgment normal.    BP 113/74   Pulse 71   Temp (!) 97.5 F (36.4 C) (Temporal)   Resp 20   Ht 6' 2"  (1.88 m)   Wt 287 lb (130.2 kg)   SpO2 100%   BMI 36.85 kg/m         Assessment & Plan:  ALDINE GRAINGER comes in today with chief complaint of Annual Exam   Diagnosis and orders addressed:  1. Annual physical exam  2. Primary hypertension Low sodium diet - CBC with Differential/Platelet - CMP14+EGFR  3. Pure hypercholesterolemia Low fat diet - atorvastatin (LIPITOR) 80 MG tablet; TAKE 1 TABLET DAILY AT 6PM  Dispense: 90 tablet; Refill: 1 - Lipid panel  4. Coronary artery disease involving native coronary artery of native heart without angina pectoris Keep followupwith cardiology - metoprolol tartrate (LOPRESSOR) 25 MG tablet; Take 1 tablet (25 mg total) by mouth 2 (two) times daily.  Dispense: 180 tablet; Refill: 1  5. Tobacco abuse Stop dipping  6. BMI 35.0-35.9,adult Discussed diet and exercise for person with BMI >25 Will recheck weight in 3-6 months  7. Acute midline low back pain without sciatica Moist heat rest -  cyclobenzaprine (FLEXERIL) 10 MG tablet; Take 1 tablet (10 mg total) by mouth as needed for muscle spasms.  Dispense: 30 tablet; Refill: 2  * referral for colonoscopy  Labs pending Health Maintenance reviewed Diet and exercise encouraged  Follow up plan: 6 months   Mary-Margaret Hassell Done, FNP

## 2020-12-12 LAB — LIPID PANEL
Chol/HDL Ratio: 2.9 ratio (ref 0.0–5.0)
Cholesterol, Total: 113 mg/dL (ref 100–199)
HDL: 39 mg/dL — ABNORMAL LOW (ref >39)
LDL Chol Calc (NIH): 60 mg/dL (ref 0–99)
Triglycerides: 64 mg/dL (ref 0–149)
VLDL Cholesterol Cal: 14 mg/dL (ref 5–40)

## 2020-12-12 LAB — CBC WITH DIFFERENTIAL/PLATELET
Basophils Absolute: 0.1 10*3/uL (ref 0.0–0.2)
Basos: 1 %
EOS (ABSOLUTE): 0.2 10*3/uL (ref 0.0–0.4)
Eos: 2 %
Hematocrit: 45.3 % (ref 37.5–51.0)
Hemoglobin: 15.8 g/dL (ref 13.0–17.7)
Immature Grans (Abs): 0 10*3/uL (ref 0.0–0.1)
Immature Granulocytes: 0 %
Lymphocytes Absolute: 2.1 10*3/uL (ref 0.7–3.1)
Lymphs: 28 %
MCH: 31 pg (ref 26.6–33.0)
MCHC: 34.9 g/dL (ref 31.5–35.7)
MCV: 89 fL (ref 79–97)
Monocytes Absolute: 0.6 10*3/uL (ref 0.1–0.9)
Monocytes: 8 %
Neutrophils Absolute: 4.6 10*3/uL (ref 1.4–7.0)
Neutrophils: 61 %
Platelets: 230 10*3/uL (ref 150–450)
RBC: 5.1 x10E6/uL (ref 4.14–5.80)
RDW: 12 % (ref 11.6–15.4)
WBC: 7.5 10*3/uL (ref 3.4–10.8)

## 2020-12-12 LAB — CMP14+EGFR
ALT: 33 IU/L (ref 0–44)
AST: 27 IU/L (ref 0–40)
Albumin/Globulin Ratio: 2.2 (ref 1.2–2.2)
Albumin: 4.6 g/dL (ref 3.8–4.9)
Alkaline Phosphatase: 90 IU/L (ref 44–121)
BUN/Creatinine Ratio: 8 — ABNORMAL LOW (ref 9–20)
BUN: 9 mg/dL (ref 6–24)
Bilirubin Total: 0.5 mg/dL (ref 0.0–1.2)
CO2: 23 mmol/L (ref 20–29)
Calcium: 9.3 mg/dL (ref 8.7–10.2)
Chloride: 101 mmol/L (ref 96–106)
Creatinine, Ser: 1.13 mg/dL (ref 0.76–1.27)
Globulin, Total: 2.1 g/dL (ref 1.5–4.5)
Glucose: 102 mg/dL — ABNORMAL HIGH (ref 65–99)
Potassium: 4.9 mmol/L (ref 3.5–5.2)
Sodium: 139 mmol/L (ref 134–144)
Total Protein: 6.7 g/dL (ref 6.0–8.5)
eGFR: 76 mL/min/{1.73_m2} (ref >59)

## 2020-12-12 LAB — PSA, TOTAL AND FREE
PSA, Free Pct: 18.9 %
PSA, Free: 0.17 ng/mL
Prostate Specific Ag, Serum: 0.9 ng/mL (ref 0.0–4.0)

## 2021-01-09 ENCOUNTER — Other Ambulatory Visit: Payer: Self-pay | Admitting: Nurse Practitioner

## 2021-01-09 DIAGNOSIS — Z1211 Encounter for screening for malignant neoplasm of colon: Secondary | ICD-10-CM

## 2021-03-09 ENCOUNTER — Ambulatory Visit: Payer: 59 | Admitting: Family

## 2021-06-12 ENCOUNTER — Other Ambulatory Visit: Payer: Self-pay

## 2021-06-12 ENCOUNTER — Ambulatory Visit: Payer: 59 | Admitting: Nurse Practitioner

## 2021-06-12 ENCOUNTER — Encounter: Payer: Self-pay | Admitting: Nurse Practitioner

## 2021-06-12 VITALS — BP 119/77 | HR 61 | Temp 97.9°F | Resp 20 | Ht 74.0 in | Wt 274.0 lb

## 2021-06-12 DIAGNOSIS — I2510 Atherosclerotic heart disease of native coronary artery without angina pectoris: Secondary | ICD-10-CM | POA: Diagnosis not present

## 2021-06-12 DIAGNOSIS — I10 Essential (primary) hypertension: Secondary | ICD-10-CM | POA: Diagnosis not present

## 2021-06-12 DIAGNOSIS — Z1211 Encounter for screening for malignant neoplasm of colon: Secondary | ICD-10-CM

## 2021-06-12 DIAGNOSIS — E7800 Pure hypercholesterolemia, unspecified: Secondary | ICD-10-CM

## 2021-06-12 DIAGNOSIS — I214 Non-ST elevation (NSTEMI) myocardial infarction: Secondary | ICD-10-CM | POA: Diagnosis not present

## 2021-06-12 DIAGNOSIS — Z1212 Encounter for screening for malignant neoplasm of rectum: Secondary | ICD-10-CM

## 2021-06-12 DIAGNOSIS — Z720 Tobacco use: Secondary | ICD-10-CM

## 2021-06-12 DIAGNOSIS — Z6835 Body mass index (BMI) 35.0-35.9, adult: Secondary | ICD-10-CM

## 2021-06-12 MED ORDER — CLOPIDOGREL BISULFATE 75 MG PO TABS: 75.0000 mg | ORAL_TABLET | Freq: Every day | ORAL | 1 refills | Status: DC

## 2021-06-12 MED ORDER — METOPROLOL TARTRATE 25 MG PO TABS: 25.0000 mg | ORAL_TABLET | Freq: Two times a day (BID) | ORAL | 1 refills | Status: DC

## 2021-06-12 MED ORDER — ATORVASTATIN CALCIUM 80 MG PO TABS: ORAL_TABLET | ORAL | 1 refills | Status: DC

## 2021-06-12 NOTE — Progress Notes (Signed)
Subjective:    Patient ID: Cameron [name], male    DOB: 08-13-1966, 55 y.o.   MRN: [id number]   Chief Complaint: Medical Management of Chronic Issues (6 mo )    HPI:  1. Primary hypertension No c/o chest pain, sob or headache. Does not check blood pressure at home. BP Readings from Last 3 Encounters:  06/12/21 119/77  12/11/20 113/74  07/18/20 114/76     2. Pure hypercholesterolemia Does try to watch diet and exercises several times a week. Lab Results  Component Value Date   CHOL 113 12/11/2020   HDL 39 (L) 12/11/2020   LDLCALC 60 12/11/2020   TRIG 64 12/11/2020   CHOLHDL 2.9 12/11/2020     3. NSTEMI (non-ST elevated myocardial infarction) ([place]) Had heart attack and stent placement  3years ago. Has been doing well. Has yearly  followed up with cardiology.  4. Coronary artery disease involving native coronary artery of native heart without angina pectoris He saw cardiology in 06/2020. Has echo and was normal.  5. Tobacco abuse Dips 2-3 x a day  6. BMI 36.0-36.9,adult Weight is down 7lbs Wt Readings from Last 3 Encounters:  06/12/21 274 lb (124.3 kg)  12/11/20 287 lb (130.2 kg)  07/18/20 278 lb 12.8 oz (126.5 kg)   BMI Readings from Last 3 Encounters:  06/12/21 35.18 kg/m  12/11/20 36.85 kg/m  07/18/20 35.80 kg/m       Outpatient Encounter Medications as of 06/12/2021  Medication Sig   aspirin 81 MG tablet Take 81 mg by mouth daily.   atorvastatin (LIPITOR) 80 MG tablet TAKE 1 TABLET DAILY AT 6PM   clopidogrel (PLAVIX) 75 MG tablet Take 1 tablet (75 mg total) by mouth daily.   cyclobenzaprine (FLEXERIL) 10 MG tablet Take 1 tablet (10 mg total) by mouth as needed for muscle spasms.   metoprolol tartrate (LOPRESSOR) 25 MG tablet Take 1 tablet (25 mg total) by mouth 2 (two) times daily.   nitroGLYCERIN (NITROSTAT) 0.4 MG SL tablet Place 1 tablet (0.4 mg total) under the tongue every 5 (five) minutes x 3 doses as needed for chest pain. (Patient not  taking: Reported on 06/12/2021)   sildenafil (VIAGRA) 50 MG tablet Take 1 tablet (50 mg total) by mouth as needed for erectile dysfunction. (Patient not taking: Reported on 06/12/2021)   No facility-administered encounter medications on file as of 06/12/2021.    Past Surgical History:  Procedure Laterality Date   CARDIAC CATHETERIZATION N/A 05/08/2016   Procedure: Left Heart Cath and Coronary Angiography;  Surgeon: Nelva Bush, MD;  Location: [facility];  Service: Cardiovascular;  Laterality: N/A;   CARDIAC CATHETERIZATION N/A 05/08/2016   Procedure: Coronary Stent Intervention;  Surgeon: Nelva Bush, MD;  Location: [facility];  Service: Cardiovascular;  Laterality: N/A;  Mid LAD Ostial/Prox Ramus   JOINT REPLACEMENT Left 11/12   knee   KNEE SURGERY      Family History  Problem Relation Age of Onset   Thyroid disease Mother    Cancer Mother        moles- removed   Stroke Father        died in mid 68's during lower ext bypass   Heart attack Father     New complaints: None otday  Social history: Lives with wife and daughter. Is a Engineer, structural.  Controlled substance contract: n/a     Review of Systems  Constitutional:  Negative for diaphoresis.  Eyes:  Negative for pain.  Respiratory:  Negative for shortness of breath.   Cardiovascular:  Negative for chest pain, palpitations and leg swelling.  Gastrointestinal:  Negative for abdominal pain.  Endocrine: Negative for polydipsia.  Skin:  Negative for rash.  Neurological:  Negative for dizziness, weakness and headaches.  Hematological:  Does not bruise/bleed easily.  All other systems reviewed and are negative.     Objective:   Physical Exam Vitals and nursing note reviewed.  Constitutional:      Appearance: Normal appearance. He is well-developed.  HENT:     Head: Normocephalic.     Nose: Nose normal.  Eyes:     Pupils: Pupils are equal, round, and reactive to light.  Neck:     Thyroid: No  thyroid mass or thyromegaly.     Vascular: No carotid bruit or JVD.     Trachea: Phonation normal.  Cardiovascular:     Rate and Rhythm: Normal rate and regular rhythm.  Pulmonary:     Effort: Pulmonary effort is normal. No respiratory distress.     Breath sounds: Normal breath sounds.  Abdominal:     General: Bowel sounds are normal.     Palpations: Abdomen is soft.     Tenderness: There is no abdominal tenderness.  Musculoskeletal:        General: Normal range of motion.     Cervical back: Normal range of motion and neck supple.  Lymphadenopathy:     Cervical: No cervical adenopathy.  Skin:    General: Skin is warm and dry.  Neurological:     Mental Status: He is alert and oriented to person, place, and time.  Psychiatric:        Behavior: Behavior normal.        Thought Content: Thought content normal.        Judgment: Judgment normal.    BP 119/77   Pulse 61   Temp 97.9 F (36.6 C)   Resp 20   Ht _0  (1.88 m)   Wt 274 lb (124.3 kg)   SpO2 98%   BMI 35.18 kg/m        Assessment & Plan:  Cameron [name] comes in today with chief complaint of Medical Management of Chronic Issues (6 mo )   Diagnosis and orders addressed:  1. Primary hypertension Low sodium diet - CBC with Differential/Platelet - CMP14+EGFR  2. Pure hypercholesterolemia Low fat diet - atorvastatin (LIPITOR) 80 MG tablet; TAKE 1 TABLET DAILY AT 6PM  Dispense: 90 tablet; Refill: 1 - Lipid panel  3. NSTEMI (non-ST elevated myocardial infarction) ([facility]) - clopidogrel (PLAVIX) 75 MG tablet; Take 1 tablet (75 mg total) by mouth daily.  Dispense: 90 tablet; Refill: 1  4. Coronary artery disease involving native coronary artery of native heart without angina pectoris Keep follow up with cardiology - metoprolol tartrate (LOPRESSOR) 25 MG tablet; Take 1 tablet (25 mg total) by mouth 2 (two) times daily.  Dispense: 180 tablet; Refill: 1  5. Tobacco abuse Stop dipping  6. BMI  35.0-35.9,adult Discussed diet and exercise for person with BMI >25 Will recheck weight in 3-6 months    Labs pending Health Maintenance reviewed Diet and exercise encouraged  Follow up plan: 6 months   Mary-Margaret Hassell Done, FNP

## 2021-06-12 NOTE — Patient Instructions (Signed)

## 2021-06-13 LAB — CMP14+EGFR
ALT: 26 IU/L (ref 0–44)
AST: 25 IU/L (ref 0–40)
Albumin/Globulin Ratio: 2.1 (ref 1.2–2.2)
Albumin: 4.5 g/dL (ref 3.8–4.9)
Alkaline Phosphatase: 84 IU/L (ref 44–121)
BUN/Creatinine Ratio: 11 (ref 9–20)
BUN: 12 mg/dL (ref 6–24)
Bilirubin Total: 0.7 mg/dL (ref 0.0–1.2)
CO2: 20 mmol/L (ref 20–29)
Calcium: 9.4 mg/dL (ref 8.7–10.2)
Chloride: 106 mmol/L (ref 96–106)
Creatinine, Ser: 1.05 mg/dL (ref 0.76–1.27)
Globulin, Total: 2.1 g/dL (ref 1.5–4.5)
Glucose: 86 mg/dL (ref 65–99)
Potassium: 4.4 mmol/L (ref 3.5–5.2)
Sodium: 143 mmol/L (ref 134–144)
Total Protein: 6.6 g/dL (ref 6.0–8.5)
eGFR: 83 mL/min/{1.73_m2} (ref >59)

## 2021-06-13 LAB — CBC WITH DIFFERENTIAL/PLATELET
Basophils Absolute: 0.1 10*3/uL (ref 0.0–0.2)
Basos: 1 %
EOS (ABSOLUTE): 0.2 10*3/uL (ref 0.0–0.4)
Eos: 2 %
Hematocrit: 46.6 % (ref 37.5–51.0)
Hemoglobin: 15.7 g/dL (ref 13.0–17.7)
Immature Grans (Abs): 0 10*3/uL (ref 0.0–0.1)
Immature Granulocytes: 0 %
Lymphocytes Absolute: 2.4 10*3/uL (ref 0.7–3.1)
Lymphs: 28 %
MCH: 30.9 pg (ref 26.6–33.0)
MCHC: 33.7 g/dL (ref 31.5–35.7)
MCV: 92 fL (ref 79–97)
Monocytes Absolute: 0.6 10*3/uL (ref 0.1–0.9)
Monocytes: 7 %
Neutrophils Absolute: 5.3 10*3/uL (ref 1.4–7.0)
Neutrophils: 62 %
Platelets: 220 10*3/uL (ref 150–450)
RBC: 5.08 x10E6/uL (ref 4.14–5.80)
RDW: 12.4 % (ref 11.6–15.4)
WBC: 8.5 10*3/uL (ref 3.4–10.8)

## 2021-06-13 LAB — LIPID PANEL
Chol/HDL Ratio: 3.1 ratio (ref 0.0–5.0)
Cholesterol, Total: 117 mg/dL (ref 100–199)
HDL: 38 mg/dL — ABNORMAL LOW (ref >39)
LDL Chol Calc (NIH): 60 mg/dL (ref 0–99)
Triglycerides: 98 mg/dL (ref 0–149)
VLDL Cholesterol Cal: 19 mg/dL (ref 5–40)

## 2021-06-20 ENCOUNTER — Ambulatory Visit: Payer: 59 | Admitting: Physician Assistant

## 2021-07-30 NOTE — Telephone Encounter (Signed)
Messaged patient to see if he can come in on Thursday 11/10 to see Dr. Angelena Form at 8:00 am.  We will be splitting the structural consult spot.

## 2021-08-06 NOTE — Telephone Encounter (Signed)
Left message to call back and sent mychart message to see if he would be available for appointment Wed 08/08/21 late am with Dr. Angelena Form.

## 2021-08-07 NOTE — Progress Notes (Signed)
Chief Complaint  Patient presents with   Follow-up    CAD     History of Present Illness: 55 yo male with history of CAD, HTN, HLD and tobacco use who is here today for cardiac follow up. He was admitted to [facility] August 2017 with a NSTEMI. Cardiac cath with severe stenosis proximal LAD treated with a drug eluting stent and severe stenosis ostial Ramus intermediate branch which was treated with a drug eluting stent. Echo November 2021 with normal LV systolic function, mild mitral regurgitation.    He is here today for follow up. The patient denies any chest pain, dyspnea, palpitations, lower extremity edema, orthopnea, PND, dizziness, near syncope or syncope. He is very active and running several days per week.   Primary Care Physician: Chevis Pretty, FNP  Past Medical History:  Diagnosis Date   Actinic keratosis 03/16/2018   Aseptic meningitis    a. 06/2003.   CAD (coronary artery disease)    a. NSTEMI: [facility] on 05/08/16 which showed severe double vessel CAD with prox-mid LAD 80% thrombotic stenosis s/p PCI/DES, oRI 95% sten s/p PCI/DES.   HLD (hyperlipidemia)    HTN (hypertension)    Osteoarthritis    a. s/p L Total Knee Arthroplasty.   Tobacco abuse    a. occasional cigarette - approx 1 pack/month.    Past Surgical History:  Procedure Laterality Date   CARDIAC CATHETERIZATION N/A 05/08/2016   Procedure: Left Heart Cath and Coronary Angiography;  Surgeon: Nelva Bush, MD;  Location: [place] [facility];  Service: Cardiovascular;  Laterality: N/A;   CARDIAC CATHETERIZATION N/A 05/08/2016   Procedure: Coronary Stent Intervention;  Surgeon: Nelva Bush, MD;  Location: [place] [facility];  Service: Cardiovascular;  Laterality: N/A;  Mid LAD Ostial/Prox Ramus   JOINT REPLACEMENT Left 11/12   knee   KNEE SURGERY      Current Outpatient Medications  Medication Sig Dispense Refill   aspirin 81 MG tablet Take 81 mg by mouth daily.     atorvastatin (LIPITOR) 80 MG tablet  TAKE 1 TABLET DAILY AT 6PM 90 tablet 1   cyclobenzaprine (FLEXERIL) 10 MG tablet Take 1 tablet (10 mg total) by mouth as needed for muscle spasms. 30 tablet 2   metoprolol tartrate (LOPRESSOR) 25 MG tablet Take 1 tablet (25 mg total) by mouth 2 (two) times daily. 180 tablet 1   nitroGLYCERIN (NITROSTAT) 0.4 MG SL tablet Place 1 tablet (0.4 mg total) under the tongue every 5 (five) minutes x 3 doses as needed for chest pain. 25 tablet 12   sildenafil (VIAGRA) 50 MG tablet Take 1 tablet (50 mg total) by mouth as needed for erectile dysfunction. 20 tablet 1   No current facility-administered medications for this visit.    No Known Allergies  Social History   Socioeconomic History   Marital status: Married    Spouse name: Not on file   Number of children: Not on file   Years of education: Not on file   Highest education level: Not on file  Occupational History   Occupation: Engineer, structural    Employer: [employer]  Tobacco Use   Smoking status: Former   Smokeless tobacco: Current    Types: Snuff   Tobacco comments:    smokes about 1 pack of cigarettes/month.  Vaping Use   Vaping Use: Never used  Substance and Sexual Activity   Alcohol use: Yes    Comment: occasional.   Drug use: No   Sexual activity: Yes  Other Topics Concern   Not on file  Social History Narrative   Lives in [place] with wife and children.  Regularly exercises.   Social Determinants of Health   Financial Resource Strain: Not on file  Food Insecurity: Not on file  Transportation Needs: Not on file  Physical Activity: Not on file  Stress: Not on file  Social Connections: Not on file  Intimate Partner Violence: Not on file    Family History  Problem Relation Age of Onset   Thyroid disease Mother    Cancer Mother        moles- removed   Stroke Father        died in mid 36's during lower ext bypass   Heart attack Father     Review of Systems:  As stated in the HPI and otherwise negative.   BP  112/70   Pulse 65   Ht 6\' 2"  (1.88 m)   Wt 282 lb (127.9 kg)   SpO2 98%   BMI 36.21 kg/m   Physical Examination:  General: Well developed, well nourished, NAD  HEENT: OP clear, mucus membranes moist  SKIN: warm, dry. No rashes. Neuro: No focal deficits  Musculoskeletal: Muscle strength 5/5 all ext  Psychiatric: Mood and affect normal  Neck: No JVD, no carotid bruits, no thyromegaly, no lymphadenopathy.  Lungs:Clear bilaterally, no wheezes, rhonci, crackles Cardiovascular: Regular rate and rhythm. No murmurs, gallops or rubs. Abdomen:Soft. Bowel sounds present. Non-tender.  Extremities: No lower extremity edema. Pulses are 2 + in the bilateral DP/PT.  Echo November 2021:   1. Left ventricular ejection fraction, by estimation, is 60 to 65%. The  left ventricle has normal function. Left ventricular endocardial border  not optimally defined to evaluate regional wall motion. Left ventricular  diastolic parameters were normal.   2. Right ventricular systolic function is normal. The right ventricular  size is mildly enlarged. Tricuspid regurgitation signal is inadequate for  assessing PA pressure.   3. Right atrial size was moderately dilated.   4. The mitral valve is normal in structure. Mild mitral valve  regurgitation. No evidence of mitral stenosis.   5. The aortic valve is tricuspid. There is moderate calcification of the  aortic valve. There is moderate thickening of the aortic valve. Aortic  valve regurgitation is not visualized. Mild to moderate aortic valve  sclerosis/calcification is present,  without any evidence of aortic stenosis.   6. Aortic dilatation noted. There is mild dilatation of the aortic root  and of the ascending aorta, measuring 38 mm.   7. The inferior vena cava is normal in size with greater than 50%  respiratory variability, suggesting right atrial pressure of 3 mmHg.   EKG:  EKG is ordered today. The ekg ordered today demonstrates sinus  Recent  Labs: 06/12/2021: ALT 26; BUN 12; Creatinine, Ser 1.05; Hemoglobin 15.7; Platelets 220; Potassium 4.4; Sodium 143   Lipid Panel    Component Value Date/Time   CHOL 117 06/12/2021 1215   TRIG 98 06/12/2021 1215   TRIG 121 11/01/2013 1019   HDL 38 (L) 06/12/2021 1215   HDL 41 11/01/2013 1019   CHOLHDL 3.1 06/12/2021 1215   CHOLHDL 4.9 05/09/2016 0039   VLDL 71 (H) 05/09/2016 0039   LDLCALC 60 06/12/2021 1215   LDLCALC 105 (H) 11/01/2013 1019     Wt Readings from Last 3 Encounters:  08/08/21 282 lb (127.9 kg)  06/12/21 274 lb (124.3 kg)  12/11/20 287 lb (130.2 kg)     Other  studies Reviewed: Additional studies/ records that were reviewed today include: . Review of the above records demonstrates:   Assessment and Plan:  1. CAD: He presented with a NSTEMI in August 2017 and had a drug eluting stent placed in the proximal to mid LAD and a drug eluting stent placed in the ostial Ramus intermediate branch. Continue ASA, statin and beta blocker. Will stop Plavix.   2. HTN: BP well controlled. No changes in therapy  3. Hyperlipidemia: LDL at goal in September 2022. Continue statin.   Current medicines are reviewed at length with the patient today.  The patient does not have concerns regarding medicines.  The following changes have been made:  no change  Labs/ tests ordered today include:   Orders Placed This Encounter  Procedures   EKG 12-Lead    Disposition:   F/U with me in 12 months  Signed, [name], MD 08/08/2021 1:22 PM    [facility] [address], [place],   [address] Phone: [phone]; Fax: [phone]

## 2021-08-08 ENCOUNTER — Ambulatory Visit: Payer: 59 | Admitting: Cardiovascular Disease

## 2021-08-08 ENCOUNTER — Encounter: Payer: Self-pay | Admitting: Cardiovascular Disease

## 2021-08-08 ENCOUNTER — Other Ambulatory Visit: Payer: Self-pay

## 2021-08-08 VITALS — BP 112/70 | HR 65 | Ht 74.0 in | Wt 282.0 lb

## 2021-08-08 DIAGNOSIS — E785 Hyperlipidemia, unspecified: Secondary | ICD-10-CM

## 2021-08-08 DIAGNOSIS — I2510 Atherosclerotic heart disease of native coronary artery without angina pectoris: Secondary | ICD-10-CM | POA: Diagnosis not present

## 2021-08-08 DIAGNOSIS — I10 Essential (primary) hypertension: Secondary | ICD-10-CM | POA: Diagnosis not present

## 2021-08-08 NOTE — Patient Instructions (Signed)
Medication Instructions:  Stop Plavix  *If you need a refill on your cardiac medications before your next appointment, please call your pharmacy*   Lab Work: none  Testing/Procedures: none  Follow-Up: At [facility], you and your health needs are our priority.  As part of our continuing mission to provide you with exceptional heart care, we have created designated Provider Care Teams.  These Care Teams include your primary Cardiologist (physician) and Advanced Practice Providers (APPs -  Physician Assistants and Nurse Practitioners) who all work together to provide you with the care you need, when you need it.   Your next appointment:   12 month(s)  The format for your next appointment:   In Person  Provider:   [name], MD     Other Instructions

## 2021-08-31 ENCOUNTER — Telehealth: Payer: 59 | Admitting: Physician Assistant

## 2021-08-31 DIAGNOSIS — J019 Acute sinusitis, unspecified: Secondary | ICD-10-CM

## 2021-08-31 DIAGNOSIS — B9689 Other specified bacterial agents as the cause of diseases classified elsewhere: Secondary | ICD-10-CM | POA: Diagnosis not present

## 2021-08-31 MED ORDER — DOXYCYCLINE HYCLATE 100 MG PO TABS
100.0000 mg | ORAL_TABLET | Freq: Two times a day (BID) | ORAL | 0 refills | Status: DC
Start: 2021-08-31 — End: 2021-10-08

## 2021-08-31 NOTE — Progress Notes (Signed)

## 2021-08-31 NOTE — Progress Notes (Signed)
I have spent 5 minutes in review of e-visit questionnaire, review and updating patient chart, medical decision making and response to patient.   Merlyn Conley Cody Tekisha Darcey, PA-C    

## 2021-09-21 ENCOUNTER — Other Ambulatory Visit: Payer: Self-pay | Admitting: Nurse Practitioner

## 2021-10-08 ENCOUNTER — Ambulatory Visit (INDEPENDENT_AMBULATORY_CARE_PROVIDER_SITE_OTHER): Payer: 59 | Admitting: Nurse Practitioner

## 2021-10-08 ENCOUNTER — Encounter: Payer: Self-pay | Admitting: Nurse Practitioner

## 2021-10-08 VITALS — BP 113/76 | HR 67 | Temp 97.7°F | Resp 20 | Ht 74.0 in | Wt 284.0 lb

## 2021-10-08 DIAGNOSIS — L84 Corns and callosities: Secondary | ICD-10-CM

## 2021-10-08 DIAGNOSIS — D229 Melanocytic nevi, unspecified: Secondary | ICD-10-CM

## 2021-10-08 NOTE — Patient Instructions (Signed)
Corns and Calluses Corns are small areas of thickened skin that form on the top, sides, or tip of a toe. Corns have a cone-shaped core with a point that can press on a nerve below. This causes pain. Calluses are areas of thickened skin that can form anywhere on the body, including the hands, fingers, palms, soles of the feet, and heels. Calluses are usually larger than corns. What are the causes? Corns and calluses are caused by rubbing (friction) or pressure, such as from shoes that are too tight or do not fit properly. What increases the risk? Corns are more likely to develop in people who have misshapen toes (toe deformities), such as hammer toes. Calluses can form with friction to any area of the skin. They are more likely to develop in people who: Work with their hands. Wear shoes that fit poorly, are too tight, or are high-heeled. Have toe deformities. What are the signs or symptoms? Symptoms of a corn or callus include: A hard growth on the skin. Pain or tenderness under the skin. Redness and swelling. Increased discomfort while wearing tight-fitting shoes, if your feet are affected. If a corn or callus becomes infected, symptoms may include: Redness and swelling that gets worse. Pain. Fluid, blood, or pus draining from the corn or callus. How is this diagnosed? Corns and calluses may be diagnosed based on your symptoms, your medical history, and a physical exam. How is this treated? Treatment for corns and calluses may include: Removing the cause of the friction or pressure. This may involve: Changing your shoes. Wearing shoe inserts (orthotics) or other protective layers in your shoes, such as a corn pad. Wearing gloves. Applying medicine to the skin (topical medicine) to help soften skin in the hardened, thickened areas. Removing layers of dead skin with a file to reduce the size of the corn or callus. Removing the corn or callus with a scalpel or laser. Taking antibiotic  medicines, if your corn or callus is infected. Having surgery, if a toe deformity is the cause. Follow these instructions at home:  Take over-the-counter and prescription medicines only as told by your health care provider. If you were prescribed an antibiotic medicine, take it as told by your health care provider. Do not stop taking it even if your condition improves. Wear shoes that fit well. Avoid wearing high-heeled shoes and shoes that are too tight or too loose. Wear any padding, protective layers, gloves, or orthotics as told by your health care provider. Soak your hands or feet. Then use a file or pumice stone to soften your corn or callus. Do this as told by your health care provider. Check your corn or callus every day for signs of infection. Contact a health care provider if: Your symptoms do not improve with treatment. You have redness or swelling that gets worse. Your corn or callus becomes painful. You have fluid, blood, or pus coming from your corn or callus. You have new symptoms. Get help right away if: You develop severe pain with redness. Summary Corns are small areas of thickened skin that form on the top, sides, or tip of a toe. These can be painful. Calluses are areas of thickened skin that can form anywhere on the body, including the hands, fingers, palms, and soles of the feet. Calluses are usually larger than corns. Corns and calluses are caused by rubbing (friction) or pressure, such as from shoes that are too tight or do not fit properly. Treatment may include wearing padding, protective   layers, gloves, or orthotics as told by your health care provider. This information is not intended to replace advice given to you by your health care provider. Make sure you discuss any questions you have with your health care provider. Document Revised: 01/06/2020 Document Reviewed: 01/06/2020 Elsevier Patient Education  2022 Elsevier Inc.  

## 2021-10-08 NOTE — Progress Notes (Signed)
° °  Subjective:    Patient ID: Cameron [name], male    DOB: 11/30/1965, 56 y.o.   MRN: [id number]   Chief Complaint: Toe on left foot inflamed and Nevus   HPI Patient comes in today c/o left middle toe. Has been hurting for 3 months. Says pain is on tip of toe and sometimes hurts when bed sheets touch it. He has not taken anything fo rit.  Has 2 moles he wants looked at.    Review of Systems  Constitutional:  Negative for diaphoresis.  Eyes:  Negative for pain.  Respiratory:  Negative for shortness of breath.   Cardiovascular:  Negative for chest pain, palpitations and leg swelling.  Gastrointestinal:  Negative for abdominal pain.  Endocrine: Negative for polydipsia.  Skin:  Negative for rash.  Neurological:  Negative for dizziness, weakness and headaches.  Hematological:  Does not bruise/bleed easily.  All other systems reviewed and are negative.     Objective:   Physical Exam Constitutional:      Appearance: Normal appearance.  Cardiovascular:     Rate and Rhythm: Normal rate and regular rhythm.     Heart sounds: Normal heart sounds.  Pulmonary:     Effort: Pulmonary effort is normal.     Breath sounds: Normal breath sounds.  Skin:    General: Skin is warm.     Comments: Brownish color skin lesion on right abdominal wall and left shoulder  Left middle toe nail bed tender to touch with callus formation.  Neurological:     General: No focal deficit present.     Mental Status: He is alert and oriented to person, place, and time.  Psychiatric:        Mood and Affect: Mood normal.        Behavior: Behavior normal.   BP 113/76    Pulse 67    Temp 97.7 F (36.5 C) (Temporal)    Resp 20    Ht 6\' 2"  (1.88 m)    Wt 284 lb (128.8 kg)    SpO2 95%    BMI 36.46 kg/m         Assessment & Plan:   Cameron [name] in today with chief complaint of Toe on left foot inflamed and Nevus   1. Callus of toe Epsom salt soaks BID  2. Numerous moles Referral to derm -  Ambulatory referral to Dermatology    The above assessment and management plan was discussed with the patient. The patient verbalized understanding of and has agreed to the management plan. Patient is aware to call the clinic if symptoms persist or worsen. Patient is aware when to return to the clinic for a follow-up visit. Patient educated on when it is appropriate to go to the emergency department.   Mary-Margaret Hassell Done, FNP

## 2021-10-10 ENCOUNTER — Ambulatory Visit: Payer: 59 | Admitting: Physician Assistant

## 2021-10-18 ENCOUNTER — Other Ambulatory Visit: Payer: Self-pay | Admitting: Nurse Practitioner

## 2021-10-18 DIAGNOSIS — D229 Melanocytic nevi, unspecified: Secondary | ICD-10-CM

## 2021-10-18 NOTE — Progress Notes (Signed)
Ref to derm again for sooner appointment

## 2021-10-29 ENCOUNTER — Other Ambulatory Visit: Payer: Self-pay | Admitting: Nurse Practitioner

## 2021-10-29 DIAGNOSIS — I2510 Atherosclerotic heart disease of native coronary artery without angina pectoris: Secondary | ICD-10-CM

## 2022-01-18 ENCOUNTER — Other Ambulatory Visit: Payer: Self-pay | Admitting: Nurse Practitioner

## 2022-01-18 DIAGNOSIS — E7800 Pure hypercholesterolemia, unspecified: Secondary | ICD-10-CM

## 2022-02-26 ENCOUNTER — Other Ambulatory Visit: Payer: Self-pay | Admitting: Nurse Practitioner

## 2022-02-26 DIAGNOSIS — E7800 Pure hypercholesterolemia, unspecified: Secondary | ICD-10-CM

## 2022-02-27 NOTE — Telephone Encounter (Signed)
MMM NTBS 30 days given 01/18/22

## 2022-02-28 NOTE — Telephone Encounter (Signed)
Left message for pt to call back to schedule a med refill appt with PCP.

## 2022-03-05 ENCOUNTER — Encounter: Payer: Self-pay | Admitting: Nurse Practitioner

## 2022-03-05 ENCOUNTER — Ambulatory Visit: Payer: 59 | Admitting: Nurse Practitioner

## 2022-03-05 VITALS — BP 106/74 | HR 65 | Temp 97.2°F | Resp 20 | Ht 74.0 in | Wt 282.0 lb

## 2022-03-05 DIAGNOSIS — E7800 Pure hypercholesterolemia, unspecified: Secondary | ICD-10-CM | POA: Diagnosis not present

## 2022-03-05 DIAGNOSIS — Z720 Tobacco use: Secondary | ICD-10-CM

## 2022-03-05 DIAGNOSIS — I10 Essential (primary) hypertension: Secondary | ICD-10-CM

## 2022-03-05 DIAGNOSIS — I2510 Atherosclerotic heart disease of native coronary artery without angina pectoris: Secondary | ICD-10-CM

## 2022-03-05 DIAGNOSIS — I252 Old myocardial infarction: Secondary | ICD-10-CM | POA: Diagnosis not present

## 2022-03-05 DIAGNOSIS — Z6835 Body mass index (BMI) 35.0-35.9, adult: Secondary | ICD-10-CM

## 2022-03-05 MED ORDER — ATORVASTATIN CALCIUM 80 MG PO TABS: ORAL_TABLET | ORAL | 1 refills | Status: DC

## 2022-03-05 MED ORDER — METOPROLOL TARTRATE 25 MG PO TABS: 25.0000 mg | ORAL_TABLET | Freq: Two times a day (BID) | ORAL | 1 refills | Status: DC

## 2022-03-05 NOTE — Patient Instructions (Signed)
Smokeless Tobacco Information, Adult  Tobacco is a leafy plant that contains a chemical called nicotine. Tobacco use is harmful to your health. Nicotine affects the chemicals in your brain, and this can cause you to crave nicotine. These cravings can lead to addiction. Smokeless tobacco is tobacco that you put in your mouth instead of smoking it. It may also be called chewing tobacco or snuff. Smokeless tobacco is made from the leaves of tobacco plants and comes in many forms, such as: Loose, dry leaves. Plugs or twists. Moist pouches. Dissolving lozenges or strips. Chewing, sucking, or holding the tobacco in your mouth causes your mouth to make more saliva. The saliva mixes with the tobacco to make tobacco "juice" that is swallowed or spit out. How can smokeless tobacco use affect me? All forms of tobacco contain many chemicals that can harm every organ in the body. Using smokeless tobacco increases your risk for: Cancer. Tobacco use is one of the leading causes of cancer. Smokeless tobacco is linked to cancer of the mouth, esophagus, and pancreas. Other long-term health problems, including high blood pressure, heart disease, and stroke. Addiction. Pregnancy complications. Pregnant women who use smokeless tobacco are more likely to have a miscarriage or deliver a baby too early (premature delivery). Mouth and dental problems, such as: Bad breath. Teeth that look yellow or brown. Mouth sores. Cracking and bleeding lips. Gum recession, gum disease, or tooth decay. Lesions on the soft tissues of your mouth (leukoplakia). The benefits of not using smokeless tobacco include: A healthy mind and body. Saving money. You avoid the cost of buying tobacco and the cost of treating illnesses that are caused by tobacco. What actions can I take to quit using tobacco? Ask your health care provider for help to quit using smokeless tobacco. This may involve treatment. These tips may also help you  quit: Pick a date to quit. Set a date within the next 2 weeks. This gives you time to prepare. Write down the reasons why you are quitting. Keep this list in places where you will see it often, such as on your bathroom mirror or in your car or wallet. Identify the people, places, things, and activities that make you want to use smokeless tobacco (triggers). Avoid them. Tell your family and friends that you are quitting. Look for a support group in your area. Having support can make quitting easier. Get rid of any tobacco you have and remove any tobacco smells. To do this: Throw away all containers of tobacco at home, at work, and in your car. Throw away any other items that you use regularly when you chew tobacco. Clean your car and make sure to remove all tobacco-related items. Clean your home, including curtains and carpets. Keep track of how many days have passed since you quit. It may help to cross days off a calendar. Remembering how long and hard you have worked to quit can help you avoid using smokeless tobacco again. Stay positive. Be prepared for cravings. It is common to slip up when you first quit, so take it one day at a time. Stay busy and take care of your body. Get plenty of exercise. Drink enough water to keep your urine pale yellow. Where to find support Ask your health care provider if there is a local support group for quitting smokeless tobacco and any available online resources, such as: Smoke Free: www.veterans.smokefree.gov Be Tobacco Free: www.betobaccofree.hhs.gov Tobacco Quitline: 1-855-QUIT-VET (1-855-784-8838). Hotlines, such as 1-800-QUIT-NOW (1-800-784-8669). Where to find more information   You can learn more about the risks of using smokeless tobacco and the benefits of quitting from these sources: American Cancer Society: www.cancer.org National Cancer Institute: www.cancer.gov Centers for Disease Control and Prevention: www.cdc.gov Food and Drug Administration:  www.fda.gov/tobacco-products Contact a health care provider if you have: Trouble quitting smokeless tobacco use on your own. White or other discolored patches in your mouth. Difficulty swallowing. A change in your voice. Unexplained weight loss. Stomach pain, nausea, or vomiting. Summary Smokeless tobacco contains many different chemicals that are known to cause cancer. Nicotine is an addictive chemical in smokeless tobacco that affects your brain. The benefits of not using smokeless tobacco include having a healthy mind and body and saving money. Tell your family and friends that you are quitting. Having support can make quitting easier. Ask your health care provider for help quitting smokeless tobacco. This may involve treatment. This information is not intended to replace advice given to you by your health care provider. Make sure you discuss any questions you have with your health care provider. Document Revised: 06/06/2021 Document Reviewed: 06/06/2021 Elsevier Patient Education  2023 Elsevier Inc.  

## 2022-03-05 NOTE — Addendum Note (Signed)
Addended by: Chevis Pretty on: 03/05/2022 09:05 AM   Modules accepted: Orders

## 2022-03-05 NOTE — Progress Notes (Addendum)
Subjective:    Patient ID: Cameron [name], male    DOB: 1966-07-21, 56 y.o.   MRN: [id number]   Chief Complaint: medical management of chronic issues     HPI:  LEANARD [name] is a 56 y.o. who identifies as a male who was assigned male at birth.   Social history: Lives with: wife Work history: Engineer, structural   Comes in today for follow up of the following chronic medical issues:  1. Primary hypertension No c/o chest pain, sob or headache. Does not check blood pressure at home. BP Readings from Last 3 Encounters:  10/08/21 113/76  08/08/21 112/70  06/12/21 119/77     2. Pure hypercholesterolemia Does try to watch diet and exercises on a regular basis. Lab Results  Component Value Date   CHOL 117 06/12/2021   HDL 38 (L) 06/12/2021   LDLCALC 60 06/12/2021   TRIG 98 06/12/2021   CHOLHDL 3.1 06/12/2021     3. Coronary artery disease involving native coronary artery of native heart without angina pectoris 4. History of non-ST elevation myocardial infarction (NSTEMI) Last saw cardiology on 08/08/21. Review ofoffice note showed no change in plan of care.  5. Tobacco abuse He is dipping now. daily  6. BMI 35.0-35.9,adult No recent weight changes Wt Readings from Last 3 Encounters:  03/05/22 282 lb (127.9 kg)  10/08/21 284 lb (128.8 kg)  08/08/21 282 lb (127.9 kg)   BMI Readings from Last 3 Encounters:  03/05/22 36.21 kg/m  10/08/21 36.46 kg/m  08/08/21 36.21 kg/m      New complaints: None today  No Known Allergies Outpatient Encounter Medications as of 03/05/2022  Medication Sig   aspirin 81 MG tablet Take 81 mg by mouth daily.   atorvastatin (LIPITOR) 80 MG tablet TAKE 1 TABLET DAILY AT 6PM (NEEDS TO BE SEEN BEFORE NEXT REFILL)   cyclobenzaprine (FLEXERIL) 10 MG tablet Take 1 tablet (10 mg total) by mouth as needed for muscle spasms.   metoprolol tartrate (LOPRESSOR) 25 MG tablet TAKE ONE TABLET BY MOUTH TWICE DAILY   nitroGLYCERIN (NITROSTAT) 0.4 MG  SL tablet Place 1 tablet (0.4 mg total) under the tongue every 5 (five) minutes x 3 doses as needed for chest pain.   sildenafil (VIAGRA) 50 MG tablet TAKE ONE TABLET AS NEEDED   No facility-administered encounter medications on file as of 03/05/2022.    Past Surgical History:  Procedure Laterality Date   CARDIAC CATHETERIZATION N/A 05/08/2016   Procedure: Left Heart Cath and Coronary Angiography;  Surgeon: Nelva Bush, MD;  Location: [facility];  Service: Cardiovascular;  Laterality: N/A;   CARDIAC CATHETERIZATION N/A 05/08/2016   Procedure: Coronary Stent Intervention;  Surgeon: Nelva Bush, MD;  Location: [facility];  Service: Cardiovascular;  Laterality: N/A;  Mid LAD Ostial/Prox Ramus   JOINT REPLACEMENT Left 11/12   knee   KNEE SURGERY      Family History  Problem Relation Age of Onset   Thyroid disease Mother    Cancer Mother        moles- removed   Stroke Father        died in mid 23's during lower ext bypass   Heart attack Father       Controlled substance contract: n/a     Review of Systems  Constitutional:  Negative for diaphoresis.  Eyes:  Negative for pain.  Respiratory:  Negative for shortness of breath.   Cardiovascular:  Negative for chest pain, palpitations and leg  swelling.  Gastrointestinal:  Negative for abdominal pain.  Endocrine: Negative for polydipsia.  Skin:  Negative for rash.  Neurological:  Negative for dizziness, weakness and headaches.  Hematological:  Does not bruise/bleed easily.  All other systems reviewed and are negative.      Objective:   Physical Exam Vitals and nursing note reviewed.  Constitutional:      Appearance: Normal appearance. He is well-developed.  HENT:     Head: Normocephalic.     Nose: Nose normal.     Mouth/Throat:     Mouth: Mucous membranes are moist.     Pharynx: Oropharynx is clear.  Eyes:     Pupils: Pupils are equal, round, and reactive to light.  Neck:     Thyroid: No thyroid  mass or thyromegaly.     Vascular: No carotid bruit or JVD.     Trachea: Phonation normal.  Cardiovascular:     Rate and Rhythm: Normal rate and regular rhythm.  Pulmonary:     Effort: Pulmonary effort is normal. No respiratory distress.     Breath sounds: Normal breath sounds.  Abdominal:     General: Bowel sounds are normal.     Palpations: Abdomen is soft.     Tenderness: There is no abdominal tenderness.  Musculoskeletal:        General: Normal range of motion.     Cervical back: Normal range of motion and neck supple.  Lymphadenopathy:     Cervical: No cervical adenopathy.  Skin:    General: Skin is warm and dry.  Neurological:     Mental Status: He is alert and oriented to person, place, and time.  Psychiatric:        Behavior: Behavior normal.        Thought Content: Thought content normal.        Judgment: Judgment normal.    BP 106/74   Pulse 65   Temp (!) 97.2 F (36.2 C) (Temporal)   Resp 20   Ht 6' 2"  (1.88 m)   Wt 282 lb (127.9 kg)   SpO2 95%   BMI 36.21 kg/m         Assessment & Plan:   Cameron [name] comes in today with chief complaint of Medical Management of Chronic Issues   Diagnosis and orders addressed:  1. Primary hypertension Low sodium diet  2. Pure hypercholesterolemia Low fat diet - atorvastatin (LIPITOR) 80 MG tablet; TAKE 1 TABLET DAILY AT 6PM (NEEDS TO BE SEEN BEFORE NEXT REFILL)  Dispense: 90 tablet; Refill: 1  3. Coronary artery disease involving native coronary artery of native heart without angina pectoris Keep follow up with cardiology - metoprolol tartrate (LOPRESSOR) 25 MG tablet; Take 1 tablet (25 mg total) by mouth 2 (two) times daily.  Dispense: 180 tablet; Refill: 1  4. History of non-ST elevation myocardial infarction (NSTEMI)  5. Tobacco abuse Stop dipping  6. BMI 35.0-35.9,adult Discussed diet and exercise for person with BMI >25 Will recheck weight in 3-6 months  Orders Placed This Encounter  Procedures    CBC with Differential/Platelet   CMP14+EGFR   Lipid panel      Labs pending Health Maintenance reviewed Diet and exercise encouraged  Follow up plan: 6 months   [place], FNP

## 2022-03-06 LAB — CBC WITH DIFFERENTIAL/PLATELET
Basophils Absolute: 0.1 10*3/uL (ref 0.0–0.2)
Basos: 1 %
EOS (ABSOLUTE): 0.3 10*3/uL (ref 0.0–0.4)
Eos: 4 %
Hematocrit: 47.7 % (ref 37.5–51.0)
Hemoglobin: 16.1 g/dL (ref 13.0–17.7)
Immature Grans (Abs): 0 10*3/uL (ref 0.0–0.1)
Immature Granulocytes: 0 %
Lymphocytes Absolute: 2.5 10*3/uL (ref 0.7–3.1)
Lymphs: 32 %
MCH: 31.3 pg (ref 26.6–33.0)
MCHC: 33.8 g/dL (ref 31.5–35.7)
MCV: 93 fL (ref 79–97)
Monocytes Absolute: 0.6 10*3/uL (ref 0.1–0.9)
Monocytes: 8 %
Neutrophils Absolute: 4.3 10*3/uL (ref 1.4–7.0)
Neutrophils: 55 %
Platelets: 231 10*3/uL (ref 150–450)
RBC: 5.14 x10E6/uL (ref 4.14–5.80)
RDW: 12.6 % (ref 11.6–15.4)
WBC: 7.8 10*3/uL (ref 3.4–10.8)

## 2022-03-06 LAB — CMP14+EGFR
ALT: 35 IU/L (ref 0–44)
AST: 28 IU/L (ref 0–40)
Albumin/Globulin Ratio: 2.2 (ref 1.2–2.2)
Albumin: 4.4 g/dL (ref 3.8–4.9)
Alkaline Phosphatase: 84 IU/L (ref 44–121)
BUN/Creatinine Ratio: 11 (ref 9–20)
BUN: 12 mg/dL (ref 6–24)
Bilirubin Total: 0.3 mg/dL (ref 0.0–1.2)
CO2: 22 mmol/L (ref 20–29)
Calcium: 9 mg/dL (ref 8.7–10.2)
Chloride: 105 mmol/L (ref 96–106)
Creatinine, Ser: 1.07 mg/dL (ref 0.76–1.27)
Globulin, Total: 2 g/dL (ref 1.5–4.5)
Glucose: 107 mg/dL — ABNORMAL HIGH (ref 70–99)
Potassium: 4.3 mmol/L (ref 3.5–5.2)
Sodium: 140 mmol/L (ref 134–144)
Total Protein: 6.4 g/dL (ref 6.0–8.5)
eGFR: 80 mL/min/{1.73_m2} (ref >59)

## 2022-03-06 LAB — LIPID PANEL
Chol/HDL Ratio: 2.7 ratio (ref 0.0–5.0)
Cholesterol, Total: 100 mg/dL (ref 100–199)
HDL: 37 mg/dL — ABNORMAL LOW (ref >39)
LDL Chol Calc (NIH): 46 mg/dL (ref 0–99)
Triglycerides: 87 mg/dL (ref 0–149)
VLDL Cholesterol Cal: 17 mg/dL (ref 5–40)

## 2022-07-12 ENCOUNTER — Encounter: Payer: Self-pay | Admitting: Nurse Practitioner

## 2022-07-12 ENCOUNTER — Ambulatory Visit: Payer: 59 | Admitting: Nurse Practitioner

## 2022-07-12 VITALS — BP 103/69 | HR 68 | Temp 97.7°F | Resp 20 | Ht 74.0 in | Wt 281.0 lb

## 2022-07-12 DIAGNOSIS — S161XXA Strain of muscle, fascia and tendon at neck level, initial encounter: Secondary | ICD-10-CM

## 2022-07-12 DIAGNOSIS — M79672 Pain in left foot: Secondary | ICD-10-CM

## 2022-07-12 MED ORDER — PREDNISONE 20 MG PO TABS: 40.0000 mg | ORAL_TABLET | Freq: Every day | ORAL | 0 refills | Status: AC

## 2022-07-12 MED ORDER — CYCLOBENZAPRINE HCL 10 MG PO TABS: 10.0000 mg | ORAL_TABLET | Freq: Three times a day (TID) | ORAL | 0 refills | Status: DC | PRN

## 2022-07-12 NOTE — Patient Instructions (Signed)
Cervical Strain and Sprain Rehab Ask your health care provider which exercises are safe for you. Do exercises exactly as told by your health care provider and adjust them as directed. It is normal to feel mild stretching, pulling, tightness, or discomfort as you do these exercises. Stop right away if you feel sudden pain or your pain gets worse. Do not begin these exercises until told by your health care provider. Stretching and range-of-motion exercises Cervical side bending  Using good posture, sit on a stable chair or stand up. Without moving your shoulders, slowly tilt your left / right ear to your shoulder until you feel a stretch in the opposite side neck muscles. You should be looking straight ahead. Hold for __________ seconds. Repeat with the other side of your neck. Repeat __________ times. Complete this exercise __________ times a day. Cervical rotation  Using good posture, sit on a stable chair or stand up. Slowly turn your head to the side as if you are looking over your left / right shoulder. Keep your eyes level with the ground. Stop when you feel a stretch along the side and the back of your neck. Hold for __________ seconds. Repeat this by turning to your other side. Repeat __________ times. Complete this exercise __________ times a day. Thoracic extension and pectoral stretch  Roll a towel or a small blanket so it is about 4 inches (10 cm) in diameter. Lie down on your back on a firm surface. Put the towel in the middle of your back across your spine. It should not be under your shoulder blades. Put your hands behind your head and let your elbows fall out to your sides. Hold for __________ seconds. Repeat __________ times. Complete this exercise __________ times a day. Strengthening exercises Isometric upper cervical flexion  Lie on your back with a thin pillow behind your head and a small rolled-up towel under your neck. Gently tuck your chin toward your chest and  nod your head down to look toward your feet. Do not lift your head off the pillow. Hold for __________ seconds. Release the tension slowly. Relax your neck muscles completely before you repeat this exercise. Repeat __________ times. Complete this exercise __________ times a day. Isometric cervical extension  Stand about 6 inches (15 cm) away from a wall, with your back facing the wall. Place a soft object, about 6-8 inches (15-20 cm) in diameter, between the back of your head and the wall. A soft object could be a small pillow, a ball, or a folded towel. Gently tilt your head back and press into the soft object. Keep your jaw and forehead relaxed. Hold for __________ seconds. Release the tension slowly. Relax your neck muscles completely before you repeat this exercise. Repeat __________ times. Complete this exercise __________ times a day. Posture and body mechanics Body mechanics refers to the movements and positions of your body while you do your daily activities. Posture is part of body mechanics. Good posture and healthy body mechanics can help to relieve stress in your body's tissues and joints. Good posture means that your spine is in its natural S-curve position (your spine is neutral), your shoulders are pulled back slightly, and your head is not tipped forward. The following are general guidelines for applying improved posture and body mechanics to your everyday activities. Sitting  When sitting, keep your spine neutral and keep your feet flat on the floor. Use a footrest, if necessary, and keep your thighs parallel to the floor. Avoid rounding   your shoulders, and avoid tilting your head forward. When working at a desk or a computer, keep your desk at a height where your hands are slightly lower than your elbows. Slide your chair under your desk so you are close enough to maintain good posture. When working at a computer, place your monitor at a height where you are looking straight ahead  and you do not have to tilt your head forward or downward to look at the screen. Standing  When standing, keep your spine neutral and keep your feet about hip-width apart. Keep a slight bend in your knees. Your ears, shoulders, and hips should line up. When you do a task in which you stand in one place for a long time, place one foot up on a stable object that is 2-4 inches (5-10 cm) high, such as a footstool. This helps keep your spine neutral. Resting When lying down and resting, avoid positions that are most painful for you. Try to support your neck in a neutral position. You can use a contour pillow or a small rolled-up towel. Your pillow should support your neck but not push on it. This information is not intended to replace advice given to you by your health care provider. Make sure you discuss any questions you have with your health care provider. Document Revised: 07/30/2021 Document Reviewed: 07/30/2021 Elsevier Patient Education  2023 Elsevier Inc.  

## 2022-07-12 NOTE — Progress Notes (Addendum)
Subjective:    Patient ID: Cameron [name], male    DOB: Jul 16, 1966, 56 y.o.   MRN: [id number]   Chief Complaint: Tingling in neck and head and Left foot pain   HPI  Patient comes in today with 2 complaints: - tingling in his neck for about 2 weeks. Has slight headache. Occurs intermittently and can last several hours. Radiates up head. Rates pain 6/10 when it occurs. Rubbing makes it better. He took robinol and that helped. He is out of it now. - left foot pain. Started about 2-3 weeks ago. Sharp pain on lateral side. Pain comes and goes. Standing and walking can increase pain. Resting helps   Review of Systems  Constitutional:  Negative for diaphoresis.  Eyes:  Negative for pain.  Respiratory:  Negative for shortness of breath.   Cardiovascular:  Negative for chest pain, palpitations and leg swelling.  Gastrointestinal:  Negative for abdominal pain.  Endocrine: Negative for polydipsia.  Skin:  Negative for rash.  Neurological:  Negative for dizziness, weakness and headaches.  Hematological:  Does not bruise/bleed easily.  All other systems reviewed and are negative.      Objective:   Physical Exam Vitals and nursing note reviewed.  Constitutional:      Appearance: Normal appearance. He is well-developed.  Neck:     Thyroid: No thyroid mass or thyromegaly.     Vascular: No carotid bruit or JVD.     Trachea: Phonation normal.  Cardiovascular:     Rate and Rhythm: Normal rate and regular rhythm.  Pulmonary:     Effort: Pulmonary effort is normal. No respiratory distress.     Breath sounds: Normal breath sounds.  Abdominal:     General: Bowel sounds are normal.     Palpations: Abdomen is soft.     Tenderness: There is no abdominal tenderness.  Musculoskeletal:        General: Normal range of motion.     Cervical back: Normal range of motion and neck supple.     Comments: FROM of neck without pain  Lymphadenopathy:     Cervical: No cervical adenopathy.  Skin:     General: Skin is warm and dry.  Neurological:     Mental Status: He is alert and oriented to person, place, and time.  Psychiatric:        Behavior: Behavior normal.        Thought Content: Thought content normal.        Judgment: Judgment normal.     BP 103/69   Pulse 68   Temp 97.7 F (36.5 C) (Temporal)   Resp 20   Ht '6\' 2"'$  (1.88 m)   Wt 281 lb (127.5 kg)   SpO2 96%   BMI 36.08 kg/m        Assessment & Plan:   Cameron [name] in today with chief complaint of Tingling in neck and head and Left foot pain   1. Strain of neck muscle, initial encounter Moist heat rest - cyclobenzaprine (FLEXERIL) 10 MG tablet; Take 1 tablet (10 mg total) by mouth 3 (three) times daily as needed for muscle spasms.  Dispense: 30 tablet; Refill: 0  2. Left foot pain Sit when hurts - predniSONE (DELTASONE) 20 MG tablet; Take 2 tablets (40 mg total) by mouth daily with breakfast for 5 days. 2 po daily for 5 days  Dispense: 10 tablet; Refill: 0  RTO prn  Nicotine cessation discussed- information given  The above assessment and management  plan was discussed with the patient. The patient verbalized understanding of and has agreed to the management plan. Patient is aware to call the clinic if symptoms persist or worsen. Patient is aware when to return to the clinic for a follow-up visit. Patient educated on when it is appropriate to go to the emergency department.   Mary-Margaret Hassell Done, FNP

## 2022-09-03 ENCOUNTER — Ambulatory Visit (INDEPENDENT_AMBULATORY_CARE_PROVIDER_SITE_OTHER): Payer: 59

## 2022-09-03 DIAGNOSIS — Z23 Encounter for immunization: Secondary | ICD-10-CM | POA: Diagnosis not present

## 2022-09-05 ENCOUNTER — Ambulatory Visit: Payer: 59 | Admitting: Nurse Practitioner

## 2022-09-10 ENCOUNTER — Other Ambulatory Visit: Payer: Self-pay | Admitting: Nurse Practitioner

## 2022-09-10 DIAGNOSIS — E7800 Pure hypercholesterolemia, unspecified: Secondary | ICD-10-CM

## 2022-09-13 ENCOUNTER — Telehealth: Payer: 59 | Admitting: Physician Assistant

## 2022-09-13 DIAGNOSIS — J329 Chronic sinusitis, unspecified: Secondary | ICD-10-CM | POA: Diagnosis not present

## 2022-09-13 DIAGNOSIS — J40 Bronchitis, not specified as acute or chronic: Secondary | ICD-10-CM | POA: Diagnosis not present

## 2022-09-13 MED ORDER — AZITHROMYCIN 250 MG PO TABS: ORAL_TABLET | ORAL | 0 refills | Status: AC

## 2022-09-13 NOTE — Progress Notes (Signed)
We are sorry that you are not feeling well.  Here is how we plan to help!  Based on your presentation I believe you most likely have A cough due to bacteria.  When patients have a fever and a productive cough with a change in color or increased sputum production, we are concerned about bacterial bronchitis.  If left untreated it can progress to pneumonia.  If your symptoms do not improve with your treatment plan it is important that you contact your provider.   I have prescribed Azithromyin 250 mg: two tablets now and then one tablet daily for 4 additonal days    In addition you may use A non-prescription cough medication called Mucinex DM: take 2 tablets every 12 hours.   From your responses in the eVisit questionnaire you describe inflammation in the upper respiratory tract which is causing a significant cough.  This is commonly called Bronchitis and has four common causes:   Allergies Viral Infections Acid Reflux Bacterial Infection Allergies, viruses and acid reflux are treated by controlling symptoms or eliminating the cause. An example might be a cough caused by taking certain blood pressure medications. You stop the cough by changing the medication. Another example might be a cough caused by acid reflux. Controlling the reflux helps control the cough.  USE OF BRONCHODILATOR ("RESCUE") INHALERS: There is a risk from using your bronchodilator too frequently.  The risk is that over-reliance on a medication which only relaxes the muscles surrounding the breathing tubes can reduce the effectiveness of medications prescribed to reduce swelling and congestion of the tubes themselves.  Although you feel brief relief from the bronchodilator inhaler, your asthma may actually be worsening with the tubes becoming more swollen and filled with mucus.  This can delay other crucial treatments, such as oral steroid medications. If you need to use a bronchodilator inhaler daily, several times per day, you should  discuss this with your provider.  There are probably better treatments that could be used to keep your asthma under control.     HOME CARE Only take medications as instructed by your medical team. Complete the entire course of an antibiotic. Drink plenty of fluids and get plenty of rest. Avoid close contacts especially the very young and the elderly Cover your mouth if you cough or cough into your sleeve. Always remember to wash your hands A steam or ultrasonic humidifier can help congestion.   GET HELP RIGHT AWAY IF: You develop worsening fever. You become short of breath You cough up blood. Your symptoms persist after you have completed your treatment plan MAKE SURE YOU  Understand these instructions. Will watch your condition. Will get help right away if you are not doing well or get worse.    Thank you for choosing an e-visit.  Your e-visit answers were reviewed by a board certified advanced clinical practitioner to complete your personal care plan. Depending upon the condition, your plan could have included both over the counter or prescription medications.  Please review your pharmacy choice. Make sure the pharmacy is open so you can pick up prescription now. If there is a problem, you may contact your provider through [employer] and have the prescription routed to another pharmacy.  Your safety is important to [place]. If you have drug allergies check your prescription carefully.   For the next 24 hours you can use MyChart to ask questions about today's visit, request a non-urgent call back, or ask for a work or school excuse. You will  get an email in the next two days asking about your experience. I hope that your e-visit has been valuable and will speed your recovery.  I have spent 5 minutes in review of e-visit questionnaire, review and updating patient chart, medical decision making and response to patient.   Mar Daring, PA-C

## 2022-09-19 ENCOUNTER — Encounter: Payer: Self-pay | Admitting: Nurse Practitioner

## 2022-09-19 ENCOUNTER — Ambulatory Visit (INDEPENDENT_AMBULATORY_CARE_PROVIDER_SITE_OTHER): Payer: 59 | Admitting: Nurse Practitioner

## 2022-09-19 VITALS — BP 129/73 | HR 92 | Temp 97.2°F | Ht 74.0 in | Wt 281.0 lb

## 2022-09-19 DIAGNOSIS — I2510 Atherosclerotic heart disease of native coronary artery without angina pectoris: Secondary | ICD-10-CM

## 2022-09-19 DIAGNOSIS — E7800 Pure hypercholesterolemia, unspecified: Secondary | ICD-10-CM | POA: Diagnosis not present

## 2022-09-19 DIAGNOSIS — I10 Essential (primary) hypertension: Secondary | ICD-10-CM | POA: Diagnosis not present

## 2022-09-19 DIAGNOSIS — I214 Non-ST elevation (NSTEMI) myocardial infarction: Secondary | ICD-10-CM

## 2022-09-19 DIAGNOSIS — Z6835 Body mass index (BMI) 35.0-35.9, adult: Secondary | ICD-10-CM

## 2022-09-19 DIAGNOSIS — Z125 Encounter for screening for malignant neoplasm of prostate: Secondary | ICD-10-CM

## 2022-09-19 DIAGNOSIS — Z720 Tobacco use: Secondary | ICD-10-CM

## 2022-09-19 MED ORDER — ATORVASTATIN CALCIUM 80 MG PO TABS: ORAL_TABLET | ORAL | 1 refills | Status: DC

## 2022-09-19 MED ORDER — METOPROLOL TARTRATE 25 MG PO TABS: 25.0000 mg | ORAL_TABLET | Freq: Two times a day (BID) | ORAL | 1 refills | Status: DC

## 2022-09-19 NOTE — Progress Notes (Signed)
Subjective:    Patient ID: Cameron [name], male    DOB: 01-24-1966, 56 y.o.   MRN: [id number]   Chief Complaint: medical management of chronic issues     HPI:  Cameron [name] is a 56 y.o. who identifies as a male who was assigned male at birth.   Social history: Lives with: his daughter lives with him Work history: Engineer, structural   Comes in today for follow up of the following chronic medical issues:  1. Primary hypertension No c/o chest pain, sob or headache. Doe snot check blood pressure at  home. BP Readings from Last 3 Encounters:  07/12/22 103/69  03/05/22 106/74  10/08/21 113/76     2. Pure hypercholesterolemia Does watch diet and exercises 2-3x a week. Lab Results  Component Value Date   CHOL 100 03/05/2022   HDL 37 (L) 03/05/2022   LDLCALC 46 03/05/2022   TRIG 87 03/05/2022   CHOLHDL 2.7 03/05/2022     3. Coronary artery disease involving native coronary artery of native heart without angina pectoris Had stents put in and has done well. Last saw cardiologyon 08/08/21. No changes made  to plan of care. Said he did not need  to be seen unless having issues.  4. NSTEMI (non-ST elevated myocardial infarction) ([place]) No recent chest pain since had stents put in.  5. Tobacco abuse Dips several times a day  6. BMI 35.0-35.9,adult No recent weight changes. Wt Readings from Last 3 Encounters:  07/12/22 281 lb (127.5 kg)  03/05/22 282 lb (127.9 kg)  10/08/21 284 lb (128.8 kg)   BMI Readings from Last 3 Encounters:  07/12/22 36.08 kg/m  03/05/22 36.21 kg/m  10/08/21 36.46 kg/m     New complaints: None today  No Known Allergies Outpatient Encounter Medications as of 09/19/2022  Medication Sig   aspirin 81 MG tablet Take 81 mg by mouth daily.   atorvastatin (LIPITOR) 80 MG tablet TAKE 1 TABLET BY MOUTH DAILY AT 6PM   cyclobenzaprine (FLEXERIL) 10 MG tablet Take 1 tablet (10 mg total) by mouth 3 (three) times daily as needed for muscle spasms.    metoprolol tartrate (LOPRESSOR) 25 MG tablet Take 1 tablet (25 mg total) by mouth 2 (two) times daily.   nitroGLYCERIN (NITROSTAT) 0.4 MG SL tablet Place 1 tablet (0.4 mg total) under the tongue every 5 (five) minutes x 3 doses as needed for chest pain.   sildenafil (VIAGRA) 50 MG tablet TAKE ONE TABLET AS NEEDED   No facility-administered encounter medications on file as of 09/19/2022.    Past Surgical History:  Procedure Laterality Date   CARDIAC CATHETERIZATION N/A 05/08/2016   Procedure: Left Heart Cath and Coronary Angiography;  Surgeon: Nelva Bush, MD;  Location: [facility];  Service: Cardiovascular;  Laterality: N/A;   CARDIAC CATHETERIZATION N/A 05/08/2016   Procedure: Coronary Stent Intervention;  Surgeon: Nelva Bush, MD;  Location: [facility];  Service: Cardiovascular;  Laterality: N/A;  Mid LAD Ostial/Prox Ramus   JOINT REPLACEMENT Left 11/12   knee   KNEE SURGERY      Family History  Problem Relation Age of Onset   Thyroid disease Mother    Cancer Mother        moles- removed   Stroke Father        died in mid 43's during lower ext bypass   Heart attack Father       Controlled substance contract: n/a     Review of Systems  Constitutional:  Negative for diaphoresis.  Eyes:  Negative for pain.  Respiratory:  Negative for shortness of breath.   Cardiovascular:  Negative for chest pain, palpitations and leg swelling.  Gastrointestinal:  Negative for abdominal pain.  Endocrine: Negative for polydipsia.  Skin:  Negative for rash.  Neurological:  Negative for dizziness, weakness and headaches.  Hematological:  Does not bruise/bleed easily.  All other systems reviewed and are negative.      Objective:   Physical Exam Vitals and nursing note reviewed.  Constitutional:      Appearance: Normal appearance. He is well-developed.  HENT:     Head: Normocephalic.     Nose: Nose normal.     Mouth/Throat:     Mouth: Mucous membranes are  moist.     Pharynx: Oropharynx is clear.  Eyes:     Pupils: Pupils are equal, round, and reactive to light.  Neck:     Thyroid: No thyroid mass or thyromegaly.     Vascular: No carotid bruit or JVD.     Trachea: Phonation normal.  Cardiovascular:     Rate and Rhythm: Normal rate and regular rhythm.  Pulmonary:     Effort: Pulmonary effort is normal. No respiratory distress.     Breath sounds: Normal breath sounds.  Abdominal:     General: Bowel sounds are normal.     Palpations: Abdomen is soft.     Tenderness: There is no abdominal tenderness.  Musculoskeletal:        General: Normal range of motion.     Cervical back: Normal range of motion and neck supple.  Lymphadenopathy:     Cervical: No cervical adenopathy.  Skin:    General: Skin is warm and dry.  Neurological:     Mental Status: He is alert and oriented to person, place, and time.  Psychiatric:        Behavior: Behavior normal.        Thought Content: Thought content normal.        Judgment: Judgment normal.     BP 129/73   Pulse 92   Temp (!) 97.2 F (36.2 C) (Skin)   Ht _0  (1.88 m)   Wt 281 lb (127.5 kg)   BMI 36.08 kg/m          Assessment & Plan:   Cameron [name] comes in today with chief complaint of Medical Management of Chronic Issues   Diagnosis and orders addressed:  1. Primary hypertension Low sodium diet - CBC with Differential/Platelet - CMP14+EGFR  2. Pure hypercholesterolemia Low fat diet - Lipid panel - atorvastatin (LIPITOR) 80 MG tablet; TAKE 1 TABLET BY MOUTH DAILY AT 6PM  Dispense: 90 tablet; Refill: 1  3. Coronary artery disease involving native coronary artery of native heart without angina pectoris - metoprolol tartrate (LOPRESSOR) 25 MG tablet; Take 1 tablet (25 mg total) by mouth 2 (two) times daily.  Dispense: 180 tablet; Refill: 1  4. NSTEMI (non-ST elevated myocardial infarction) ([place])  5. Tobacco abuse Decrease dipping  6. BMI 35.0-35.9,adult Discussed  diet and exercise for person with BMI >25 Will recheck weight in 3-6 months   7. Prostate cancer screening Labs pending - PSA, total and free   Labs pending Health Maintenance reviewed Diet and exercise encouraged  Follow up plan: 6 months   Mary-Margaret Hassell Done, FNP

## 2022-09-19 NOTE — Patient Instructions (Signed)
Exercising to Stay Healthy To become healthy and stay healthy, it is recommended that you do moderate-intensity and vigorous-intensity exercise. You can tell that you are exercising at a moderate intensity if your heart starts beating faster and you start breathing faster but can still hold a conversation. You can tell that you are exercising at a vigorous intensity if you are breathing much harder and faster and cannot hold a conversation while exercising. How can exercise benefit me? Exercising regularly is important. It has many health benefits, such as: Improving overall fitness, flexibility, and endurance. Increasing bone density. Helping with weight control. Decreasing body fat. Increasing muscle strength and endurance. Reducing stress and tension, anxiety, depression, or anger. Improving overall health. What guidelines should I follow while exercising? Before you start a new exercise program, talk with your health care provider. Do not exercise so much that you hurt yourself, feel dizzy, or get very short of breath. Wear comfortable clothes and wear shoes with good support. Drink plenty of water while you exercise to prevent dehydration or heat stroke. Work out until your breathing and your heartbeat get faster (moderate intensity). How often should I exercise? Choose an activity that you enjoy, and set realistic goals. Your health care provider can help you make an activity plan that is individually designed and works best for you. Exercise regularly as told by your health care provider. This may include: Doing strength training two times a week, such as: Lifting weights. Using resistance bands. Push-ups. Sit-ups. Yoga. Doing a certain intensity of exercise for a given amount of time. Choose from these options: A total of 150 minutes of moderate-intensity exercise every week. A total of 75 minutes of vigorous-intensity exercise every week. A mix of moderate-intensity and  vigorous-intensity exercise every week. Children, pregnant women, people who have not exercised regularly, people who are overweight, and older adults may need to talk with a health care provider about what activities are safe to perform. If you have a medical condition, be sure to talk with your health care provider before you start a new exercise program. What are some exercise ideas? Moderate-intensity exercise ideas include: Walking 1 mile (1.6 km) in about 15 minutes. Biking. Hiking. Golfing. Dancing. Water aerobics. Vigorous-intensity exercise ideas include: Walking 4.5 miles (7.2 km) or more in about 1 hour. Jogging or running 5 miles (8 km) in about 1 hour. Biking 10 miles (16.1 km) or more in about 1 hour. Lap swimming. Roller-skating or in-line skating. Cross-country skiing. Vigorous competitive sports, such as football, basketball, and soccer. Jumping rope. Aerobic dancing. What are some everyday activities that can help me get exercise? Yard work, such as: Pushing a lawn mower. Raking and bagging leaves. Washing your car. Pushing a stroller. Shoveling snow. Gardening. Washing windows or floors. How can I be more active in my day-to-day activities? Use stairs instead of an elevator. Take a walk during your lunch break. If you drive, park your car farther away from your work or school. If you take public transportation, get off one stop early and walk the rest of the way. Stand up or walk around during all of your indoor phone calls. Get up, stretch, and walk around every 30 minutes throughout the day. Enjoy exercise with a friend. Support to continue exercising will help you keep a regular routine of activity. Where to find more information You can find more information about exercising to stay healthy from: U.S. Department of Health and Human Services: www.hhs.gov Centers for Disease Control and Prevention (  CDC): www.cdc.gov Summary Exercising regularly is  important. It will improve your overall fitness, flexibility, and endurance. Regular exercise will also improve your overall health. It can help you control your weight, reduce stress, and improve your bone density. Do not exercise so much that you hurt yourself, feel dizzy, or get very short of breath. Before you start a new exercise program, talk with your health care provider. This information is not intended to replace advice given to you by your health care provider. Make sure you discuss any questions you have with your health care provider. Document Revised: 01/05/2021 Document Reviewed: 01/05/2021 Elsevier Patient Education  2023 Elsevier Inc.  

## 2022-09-20 ENCOUNTER — Telehealth: Payer: Self-pay | Admitting: *Deleted

## 2022-09-20 LAB — LIPID PANEL
Chol/HDL Ratio: 3.3 ratio (ref 0.0–5.0)
Cholesterol, Total: 121 mg/dL (ref 100–199)
HDL: 37 mg/dL — ABNORMAL LOW (ref >39)
LDL Chol Calc (NIH): 63 mg/dL (ref 0–99)
Triglycerides: 118 mg/dL (ref 0–149)
VLDL Cholesterol Cal: 21 mg/dL (ref 5–40)

## 2022-09-20 LAB — CBC WITH DIFFERENTIAL/PLATELET
Basophils Absolute: 0.1 10*3/uL (ref 0.0–0.2)
Basos: 1 %
EOS (ABSOLUTE): 0.4 10*3/uL (ref 0.0–0.4)
Eos: 4 %
Hematocrit: 46.3 % (ref 37.5–51.0)
Hemoglobin: 16.4 g/dL (ref 13.0–17.7)
Immature Grans (Abs): 0 10*3/uL (ref 0.0–0.1)
Immature Granulocytes: 0 %
Lymphocytes Absolute: 3 10*3/uL (ref 0.7–3.1)
Lymphs: 28 %
MCH: 31.9 pg (ref 26.6–33.0)
MCHC: 35.4 g/dL (ref 31.5–35.7)
MCV: 90 fL (ref 79–97)
Monocytes Absolute: 0.7 10*3/uL (ref 0.1–0.9)
Monocytes: 7 %
Neutrophils Absolute: 6.3 10*3/uL (ref 1.4–7.0)
Neutrophils: 60 %
Platelets: 266 10*3/uL (ref 150–450)
RBC: 5.14 x10E6/uL (ref 4.14–5.80)
RDW: 12.5 % (ref 11.6–15.4)
WBC: 10.5 10*3/uL (ref 3.4–10.8)

## 2022-09-20 LAB — CMP14+EGFR
ALT: 35 IU/L (ref 0–44)
AST: 28 IU/L (ref 0–40)
Albumin/Globulin Ratio: 1.8 (ref 1.2–2.2)
Albumin: 4.4 g/dL (ref 3.8–4.9)
Alkaline Phosphatase: 111 IU/L (ref 44–121)
BUN/Creatinine Ratio: 11 (ref 9–20)
BUN: 13 mg/dL (ref 6–24)
Bilirubin Total: 0.7 mg/dL (ref 0.0–1.2)
CO2: 22 mmol/L (ref 20–29)
Calcium: 9.6 mg/dL (ref 8.7–10.2)
Chloride: 103 mmol/L (ref 96–106)
Creatinine, Ser: 1.15 mg/dL (ref 0.76–1.27)
Globulin, Total: 2.5 g/dL (ref 1.5–4.5)
Glucose: 84 mg/dL (ref 70–99)
Potassium: 4.6 mmol/L (ref 3.5–5.2)
Sodium: 138 mmol/L (ref 134–144)
Total Protein: 6.9 g/dL (ref 6.0–8.5)
eGFR: 74 mL/min/{1.73_m2} (ref >59)

## 2022-09-20 LAB — PSA, TOTAL AND FREE
PSA, Free Pct: 23.8 %
PSA, Free: 0.19 ng/mL
Prostate Specific Ag, Serum: 0.8 ng/mL (ref 0.0–4.0)

## 2022-09-20 NOTE — Telephone Encounter (Signed)
Patient called and states he stepped on piece of glass this morning and it is stuck in foot.   Advised patient we have no openings this morning. He will need to go to urgent care or ER.   Patient agreeable.

## 2022-10-01 ENCOUNTER — Encounter: Payer: Self-pay | Admitting: Cardiovascular Disease

## 2022-10-09 ENCOUNTER — Ambulatory Visit: Payer: 59 | Attending: Cardiovascular Disease | Admitting: Cardiovascular Disease

## 2022-10-09 ENCOUNTER — Encounter: Payer: Self-pay | Admitting: Cardiovascular Disease

## 2022-10-09 ENCOUNTER — Ambulatory Visit (INDEPENDENT_AMBULATORY_CARE_PROVIDER_SITE_OTHER): Payer: 59

## 2022-10-09 VITALS — BP 120/78 | HR 71 | Ht 74.0 in | Wt 286.6 lb

## 2022-10-09 DIAGNOSIS — E785 Hyperlipidemia, unspecified: Secondary | ICD-10-CM

## 2022-10-09 DIAGNOSIS — I10 Essential (primary) hypertension: Secondary | ICD-10-CM | POA: Diagnosis not present

## 2022-10-09 DIAGNOSIS — I493 Ventricular premature depolarization: Secondary | ICD-10-CM

## 2022-10-09 DIAGNOSIS — I2510 Atherosclerotic heart disease of native coronary artery without angina pectoris: Secondary | ICD-10-CM

## 2022-10-09 MED ORDER — METOPROLOL TARTRATE 50 MG PO TABS: 50.0000 mg | ORAL_TABLET | Freq: Two times a day (BID) | ORAL | 3 refills | Status: DC

## 2022-10-09 NOTE — Patient Instructions (Signed)
Medication Instructions:  Your physician has recommended you make the following change in your medication:  1.) increase metoprolol tartrate (Lopressor) to 50 mg - one tablet twice a day  *If you need a refill on your cardiac medications before your next appointment, please call your pharmacy*   Lab Work: none   Testing/Procedures: 3 Day Zio Heart Patch - to be applied today   Follow-Up: At [facility], you and your health needs are our priority.  As part of our continuing mission to provide you with exceptional heart care, we have created designated Provider Care Teams.  These Care Teams include your primary Cardiologist (physician) and Advanced Practice Providers (APPs -  Physician Assistants and Nurse Practitioners) who all work together to provide you with the care you need, when you need it.   Your next appointment:   12 month(s)  Provider:   [name], MD

## 2022-10-09 NOTE — Progress Notes (Unsigned)
Applied a 3 day Zio XT monitor to patient in the office 

## 2022-10-09 NOTE — Progress Notes (Signed)
Chief Complaint  Patient presents with   Follow-up    CAD   History of Present Illness: 57 yo male with history of CAD, HTN, HLD and tobacco use who is here today for cardiac follow up. He was admitted to [facility] August 2017 with a NSTEMI. Cardiac cath with severe stenosis proximal LAD treated with a drug eluting stent and severe stenosis ostial Ramus intermediate branch which was treated with a drug eluting stent. Echo November 2021 with normal LV systolic function, mild mitral regurgitation.   He is here today for follow up. He had chest pain while exercising last week with mild dyspnea. No recurrence since. He did have an upper respiratory viral illness 2 weeks prior. The patient denies any palpitations, lower extremity edema, orthopnea, PND, dizziness, near syncope or syncope. EKG today with PVCs/bigeminy.   Primary Care Physician: Chevis Pretty, FNP  Past Medical History:  Diagnosis Date   Actinic keratosis 03/16/2018   Aseptic meningitis    a. 06/2003.   CAD (coronary artery disease)    a. NSTEMI: [facility] on 05/08/16 which showed severe double vessel CAD with prox-mid LAD 80% thrombotic stenosis s/p PCI/DES, oRI 95% sten s/p PCI/DES.   HLD (hyperlipidemia)    HTN (hypertension)    Osteoarthritis    a. s/p L Total Knee Arthroplasty.   Tobacco abuse    a. occasional cigarette - approx 1 pack/month.    Past Surgical History:  Procedure Laterality Date   CARDIAC CATHETERIZATION N/A 05/08/2016   Procedure: Left Heart Cath and Coronary Angiography;  Surgeon: Nelva Bush, MD;  Location: [place] [facility];  Service: Cardiovascular;  Laterality: N/A;   CARDIAC CATHETERIZATION N/A 05/08/2016   Procedure: Coronary Stent Intervention;  Surgeon: Nelva Bush, MD;  Location: [place] [facility];  Service: Cardiovascular;  Laterality: N/A;  Mid LAD Ostial/Prox Ramus   JOINT REPLACEMENT Left 11/12   knee   KNEE SURGERY      Current Outpatient Medications  Medication Sig Dispense  Refill   aspirin 81 MG tablet Take 81 mg by mouth daily.     atorvastatin (LIPITOR) 80 MG tablet TAKE 1 TABLET BY MOUTH DAILY AT 6PM 90 tablet 1   cyclobenzaprine (FLEXERIL) 10 MG tablet Take 1 tablet (10 mg total) by mouth 3 (three) times daily as needed for muscle spasms. 30 tablet 0   metoprolol tartrate (LOPRESSOR) 50 MG tablet Take 1 tablet (50 mg total) by mouth 2 (two) times daily. 180 tablet 3   Multiple Vitamin (MULTIVITAMIN) capsule Take 1 capsule by mouth daily.     nitroGLYCERIN (NITROSTAT) 0.4 MG SL tablet Place 1 tablet (0.4 mg total) under the tongue every 5 (five) minutes x 3 doses as needed for chest pain. 25 tablet 12   sildenafil (VIAGRA) 50 MG tablet TAKE ONE TABLET AS NEEDED 20 tablet 1   No current facility-administered medications for this visit.    No Known Allergies  Social History   Socioeconomic History   Marital status: Married    Spouse name: Not on file   Number of children: Not on file   Years of education: Not on file   Highest education level: Not on file  Occupational History   Occupation: Engineer, structural    Employer: [employer]  Tobacco Use   Smoking status: Former   Smokeless tobacco: Current    Types: Snuff   Tobacco comments:    smokes about 1 pack of cigarettes/month.  Vaping Use   Vaping Use: Never used  Substance and Sexual Activity   Alcohol use: Yes    Comment: occasional.   Drug use: No   Sexual activity: Yes  Other Topics Concern   Not on file  Social History Narrative   Lives in [place] with wife and children.  Regularly exercises.   Social Determinants of Health   Financial Resource Strain: Not on file  Food Insecurity: Not on file  Transportation Needs: Not on file  Physical Activity: Not on file  Stress: Not on file  Social Connections: Not on file  Intimate Partner Violence: Not on file    Family History  Problem Relation Age of Onset   Thyroid disease Mother    Cancer Mother        moles- removed   Stroke  Father        died in mid 50's during lower ext bypass   Heart attack Father     Review of Systems:  As stated in the HPI and otherwise negative.   BP 120/78   Pulse 71   Ht '6\' 2"'$  (1.88 m)   Wt 130 kg   SpO2 98%   BMI 36.80 kg/m   Physical Examination: General: Well developed, well nourished, NAD  HEENT: OP clear, mucus membranes moist  SKIN: warm, dry. No rashes. Neuro: No focal deficits  Musculoskeletal: Muscle strength 5/5 all ext  Psychiatric: Mood and affect normal  Neck: No JVD, no carotid bruits, no thyromegaly, no lymphadenopathy.  Lungs:Clear bilaterally, no wheezes, rhonci, crackles Cardiovascular: Regular rate and rhythm. No murmurs, gallops or rubs. Abdomen:Soft. Bowel sounds present. Non-tender.  Extremities: No lower extremity edema. Pulses are 2 + in the bilateral DP/PT.  Echo November 2021:  1. Left ventricular ejection fraction, by estimation, is 60 to 65%. The  left ventricle has normal function. Left ventricular endocardial border  not optimally defined to evaluate regional wall motion. Left ventricular  diastolic parameters were normal.   2. Right ventricular systolic function is normal. The right ventricular  size is mildly enlarged. Tricuspid regurgitation signal is inadequate for  assessing PA pressure.   3. Right atrial size was moderately dilated.   4. The mitral valve is normal in structure. Mild mitral valve  regurgitation. No evidence of mitral stenosis.   5. The aortic valve is tricuspid. There is moderate calcification of the  aortic valve. There is moderate thickening of the aortic valve. Aortic  valve regurgitation is not visualized. Mild to moderate aortic valve  sclerosis/calcification is present,  without any evidence of aortic stenosis.   6. Aortic dilatation noted. There is mild dilatation of the aortic root  and of the ascending aorta, measuring 38 mm.   7. The inferior vena cava is normal in size with greater than 50%  respiratory  variability, suggesting right atrial pressure of 3 mmHg.   EKG:  EKG is ordered today. The ekg ordered today demonstrates Sinus, PVCs-bigeminy  Recent Labs: 09/19/2022: ALT 35; BUN 13; Creatinine, Ser 1.15; Hemoglobin 16.4; Platelets 266; Potassium 4.6; Sodium 138   Lipid Panel    Component Value Date/Time   CHOL 121 09/19/2022 1457   TRIG 118 09/19/2022 1457   TRIG 121 11/01/2013 1019   HDL 37 (L) 09/19/2022 1457   HDL 41 11/01/2013 1019   CHOLHDL 3.3 09/19/2022 1457   CHOLHDL 4.9 05/09/2016 0039   VLDL 71 (H) 05/09/2016 0039   LDLCALC 63 09/19/2022 1457   LDLCALC 105 (H) 11/01/2013 1019     Wt Readings from Last 3 Encounters:  10/09/22 130 kg  09/19/22 127.5 kg  07/12/22 127.5 kg    Assessment and Plan:  1. CAD: He presented with a NSTEMI in August 2017 and had a drug eluting stent placed in the proximal to mid LAD and a drug eluting stent placed in the ostial Ramus intermediate branch. He has done well since then. Echo in 2021 with normal LV function. Only minimal chest pain x 1 over the past year. Some dyspnea but this may be due to his PVCs. Will continue ASA, beta blocker and statin.    2. HTN: BP is controlled. No changes  3. Hyperlipidemia: LDL 63 in December 2023. Continue statin.   4. PVCs: Will increase his metoprolol to 50 mg po BID. Will arrange a 3 day Zio monitor.   Labs/ tests ordered today include:   Orders Placed This Encounter  Procedures   LONG TERM MONITOR (3-14 DAYS)   EKG 12-Lead   Disposition:   F/U with me in 12 months  Signed, [name], MD 10/09/2022 4:15 PM    [facility] [address], [place], [place]  [address] Phone: [phone]; Fax: [phone]

## 2022-10-10 ENCOUNTER — Encounter: Payer: Self-pay | Admitting: Cardiovascular Disease

## 2023-03-06 ENCOUNTER — Other Ambulatory Visit: Payer: Self-pay | Admitting: Nurse Practitioner

## 2023-03-06 DIAGNOSIS — S161XXA Strain of muscle, fascia and tendon at neck level, initial encounter: Secondary | ICD-10-CM

## 2023-03-10 ENCOUNTER — Other Ambulatory Visit: Payer: Self-pay | Admitting: Nurse Practitioner

## 2023-03-10 DIAGNOSIS — S161XXA Strain of muscle, fascia and tendon at neck level, initial encounter: Secondary | ICD-10-CM

## 2023-03-24 ENCOUNTER — Ambulatory Visit (INDEPENDENT_AMBULATORY_CARE_PROVIDER_SITE_OTHER): Payer: BC Managed Care – PPO | Admitting: Nurse Practitioner

## 2023-03-24 ENCOUNTER — Encounter: Payer: Self-pay | Admitting: Nurse Practitioner

## 2023-03-24 VITALS — BP 114/76 | HR 74 | Temp 97.8°F | Resp 20 | Ht 74.0 in | Wt 278.0 lb

## 2023-03-24 DIAGNOSIS — I214 Non-ST elevation (NSTEMI) myocardial infarction: Secondary | ICD-10-CM

## 2023-03-24 DIAGNOSIS — I10 Essential (primary) hypertension: Secondary | ICD-10-CM

## 2023-03-24 DIAGNOSIS — E7800 Pure hypercholesterolemia, unspecified: Secondary | ICD-10-CM

## 2023-03-24 DIAGNOSIS — I2510 Atherosclerotic heart disease of native coronary artery without angina pectoris: Secondary | ICD-10-CM

## 2023-03-24 DIAGNOSIS — Z6835 Body mass index (BMI) 35.0-35.9, adult: Secondary | ICD-10-CM

## 2023-03-24 DIAGNOSIS — S161XXA Strain of muscle, fascia and tendon at neck level, initial encounter: Secondary | ICD-10-CM

## 2023-03-24 DIAGNOSIS — Z720 Tobacco use: Secondary | ICD-10-CM

## 2023-03-24 MED ORDER — CYCLOBENZAPRINE HCL 10 MG PO TABS
ORAL_TABLET | ORAL | 1 refills | Status: AC
Start: 2023-03-24 — End: ?

## 2023-03-24 MED ORDER — ATORVASTATIN CALCIUM 80 MG PO TABS
ORAL_TABLET | ORAL | 1 refills | Status: DC
Start: 2023-03-24 — End: 2023-09-08

## 2023-03-24 NOTE — Progress Notes (Signed)
Subjective:    Patient ID: Cameron [name], male    DOB: October 04, 1965, 57 y.o.   MRN: [id number]   Chief Complaint: medical management of chronic issues     HPI:  Cameron [name] is a 57 y.o. who identifies as a male who was assigned male at birth.   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 Primary hypertension No c/o chest pain, sob or headache. Does not check blood pressure at  home. BP Readings from Last 3 Encounters:  10/09/22 120/78  09/19/22 129/73  07/12/22 103/69     2. Pure hypercholesterolemia Does try to watch diet and exercises several times a week. Lab Results  Component Value Date   CHOL 121 09/19/2022   HDL 37 (L) 09/19/2022   LDLCALC 63 09/19/2022   TRIG 118 09/19/2022   CHOLHDL 3.3 09/19/2022     3. Coronary artery disease involving native coronary artery of native heart without angina pectoris 4. NSTEMI (non-ST elevated myocardial infarction) [facility]) Last saw cardiology on 10/09/22. He was having palpitations at the time that were dx as PVC's. Only change to plan of cre was increase in metoprol to 50mg  BID. They had him wear a ZIO monitor for 3 days. Patient told themthat he felt bad on 50mg  of metoprolol and he went back to 25mg  BID and his PVC's increased. It was decided to leave him on 25mg  BID and he is to let them know how he is feeling.  5. Tobacco abuse Dips daiy  6. BMI 35.0-35.9,adult Weight is down 8lbs  Wt Readings from Last 3 Encounters:  03/24/23 278 lb (126.1 kg)  10/09/22 286 lb 9.6 oz (130 kg)  09/19/22 281 lb (127.5 kg)   BMI Readings from Last 3 Encounters:  03/24/23 35.69 kg/m  10/09/22 36.80 kg/m  09/19/22 36.08 kg/m     New complaints: None today  No Known Allergies Outpatient Encounter Medications as of 03/24/2023  Medication Sig   aspirin  81 MG tablet Take 81 mg by mouth daily.   atorvastatin (LIPITOR) 80 MG tablet TAKE 1 TABLET BY MOUTH DAILY AT 6PM   cyclobenzaprine (FLEXERIL) 10 MG tablet TAKE 1 TABLET BY MOUTH 3 TIMES DAILY AS NEEDED FOR MUSCLE SPAMS   metoprolol tartrate (LOPRESSOR) 50 MG tablet Take 1 tablet (50 mg total) by mouth 2 (two) times daily.   Multiple Vitamin (MULTIVITAMIN) capsule Take 1 capsule by mouth daily.   nitroGLYCERIN (NITROSTAT) 0.4 MG SL tablet Place 1 tablet (0.4 mg total) under the tongue every 5 (five) minutes x 3 doses as needed for chest pain.   sildenafil (VIAGRA) 50 MG tablet TAKE ONE TABLET AS NEEDED   No facility-administered encounter medications on file as of 03/24/2023.    Past Surgical History:  Procedure Laterality Date   CARDIAC CATHETERIZATION N/A 05/08/2016   Procedure: Left Heart Cath and Coronary Angiography;  Surgeon: Yvonne Kendall, MD;  Location: [facility] INVASIVE CV LAB;  Service: Cardiovascular;  Laterality: N/A;   CARDIAC CATHETERIZATION N/A 05/08/2016   Procedure: Coronary Stent Intervention;  Surgeon: Yvonne Kendall, MD;  Location: [facility] INVASIVE CV LAB;  Service: Cardiovascular;  Laterality: N/A;  Mid LAD Ostial/Prox Ramus   JOINT REPLACEMENT Left 11/12   knee   KNEE SURGERY      Family History  Problem Relation Age of Onset  Thyroid disease Mother    Cancer Mother        moles- removed   Stroke Father        died in mid 48's during lower ext bypass   Heart attack Father       Controlled substance contract: n/a     Review of Systems  Constitutional:  Negative for diaphoresis.  Eyes:  Negative for pain.  Respiratory:  Negative for shortness of breath.   Cardiovascular:  Negative for chest pain, palpitations and leg swelling.  Gastrointestinal:  Negative for abdominal pain.  Endocrine: Negative for polydipsia.  Skin:  Negative for rash.  Neurological:  Negative for dizziness, weakness and headaches.  Hematological:  Does not bruise/bleed easily.  All other  systems reviewed and are negative.      Objective:   Physical Exam Vitals and nursing note reviewed.  Constitutional:      Appearance: Normal appearance. He is well-developed.  HENT:     Head: Normocephalic.     Nose: Nose normal.     Mouth/Throat:     Mouth: Mucous membranes are moist.     Pharynx: Oropharynx is clear.  Eyes:     Pupils: Pupils are equal, round, and reactive to light.  Neck:     Thyroid: No thyroid mass or thyromegaly.     Vascular: No carotid bruit or JVD.     Trachea: Phonation normal.  Cardiovascular:     Rate and Rhythm: Normal rate and regular rhythm.  Pulmonary:     Effort: Pulmonary effort is normal. No respiratory distress.     Breath sounds: Normal breath sounds.  Abdominal:     General: Bowel sounds are normal.     Palpations: Abdomen is soft.     Tenderness: There is no abdominal tenderness.  Musculoskeletal:        General: Normal range of motion.     Cervical back: Normal range of motion and neck supple.  Lymphadenopathy:     Cervical: No cervical adenopathy.  Skin:    General: Skin is warm and dry.  Neurological:     Mental Status: He is alert and oriented to person, place, and time.  Psychiatric:        Behavior: Behavior normal.        Thought Content: Thought content normal.        Judgment: Judgment normal.    BP 114/76   Pulse 74   Temp 97.8 F (36.6 C) (Temporal)   Resp 20   Ht 6\' 2"  (1.88 m)   Wt 278 lb (126.1 kg)   SpO2 96%   BMI 35.69 kg/m         Assessment & Plan:   Cameron [name] comes in today with chief complaint of Medical Management of Chronic Issues   Diagnosis and orders addressed:  1. Primary hypertension Low sodium diet - CBC with Differential/Platelet - CMP14+EGFR  2. Pure hypercholesterolemia Low fat diet - Lipid panel - atorvastatin (LIPITOR) 80 MG tablet; TAKE 1 TABLET BY MOUTH DAILY AT 6PM  Dispense: 90 tablet; Refill: 1  3. Coronary artery disease involving native coronary artery of  native heart without angina pectoris 4. NSTEMI (non-ST elevated myocardial infarction) [facility]) Keep followup with cardiology  5. Tobacco abuse Discussed stopping tobacco  use  6. BMI 35.0-35.9,adult Discussed diet and exercise for person with BMI >25 Will recheck weight in 3-6 months   7. Strain of neck muscle, initial encounter Moist heat rest - cyclobenzaprine (FLEXERIL) 10  MG tablet; TAKE 1 TABLET BY MOUTH 3 TIMES DAILY AS NEEDED FOR MUSCLE SPAMS  Dispense: 30 tablet; Refill: 1   Labs pending Health Maintenance reviewed Diet and exercise encouraged  Follow up plan: 6 months   Mary-Margaret Daphine Deutscher, FNP

## 2023-03-25 LAB — CBC WITH DIFFERENTIAL/PLATELET
Basophils Absolute: 0.1 10*3/uL (ref 0.0–0.2)
Basos: 1 %
EOS (ABSOLUTE): 0.2 10*3/uL (ref 0.0–0.4)
Eos: 3 %
Hematocrit: 45.9 % (ref 37.5–51.0)
Hemoglobin: 16 g/dL (ref 13.0–17.7)
Immature Grans (Abs): 0 10*3/uL (ref 0.0–0.1)
Immature Granulocytes: 0 %
Lymphocytes Absolute: 2.5 10*3/uL (ref 0.7–3.1)
Lymphs: 34 %
MCH: 32 pg (ref 26.6–33.0)
MCHC: 34.9 g/dL (ref 31.5–35.7)
MCV: 92 fL (ref 79–97)
Monocytes Absolute: 0.6 10*3/uL (ref 0.1–0.9)
Monocytes: 8 %
Neutrophils Absolute: 4.1 10*3/uL (ref 1.4–7.0)
Neutrophils: 54 %
Platelets: 219 10*3/uL (ref 150–450)
RBC: 5 x10E6/uL (ref 4.14–5.80)
RDW: 12.3 % (ref 11.6–15.4)
WBC: 7.4 10*3/uL (ref 3.4–10.8)

## 2023-03-25 LAB — CMP14+EGFR
ALT: 54 IU/L — ABNORMAL HIGH (ref 0–44)
AST: 38 IU/L (ref 0–40)
Albumin: 4.5 g/dL (ref 3.8–4.9)
Alkaline Phosphatase: 90 IU/L (ref 44–121)
BUN/Creatinine Ratio: 13 (ref 9–20)
BUN: 16 mg/dL (ref 6–24)
Bilirubin Total: 0.8 mg/dL (ref 0.0–1.2)
CO2: 23 mmol/L (ref 20–29)
Calcium: 9.6 mg/dL (ref 8.7–10.2)
Chloride: 101 mmol/L (ref 96–106)
Creatinine, Ser: 1.2 mg/dL (ref 0.76–1.27)
Globulin, Total: 2.5 g/dL (ref 1.5–4.5)
Glucose: 96 mg/dL (ref 70–99)
Potassium: 4.7 mmol/L (ref 3.5–5.2)
Sodium: 138 mmol/L (ref 134–144)
Total Protein: 7 g/dL (ref 6.0–8.5)
eGFR: 70 mL/min/{1.73_m2} (ref >59)

## 2023-03-25 LAB — LIPID PANEL
Chol/HDL Ratio: 3.2 ratio (ref 0.0–5.0)
Cholesterol, Total: 125 mg/dL (ref 100–199)
HDL: 39 mg/dL — ABNORMAL LOW (ref >39)
LDL Chol Calc (NIH): 64 mg/dL (ref 0–99)
Triglycerides: 123 mg/dL (ref 0–149)
VLDL Cholesterol Cal: 22 mg/dL (ref 5–40)

## 2023-08-06 ENCOUNTER — Other Ambulatory Visit: Payer: Self-pay | Admitting: Nurse Practitioner

## 2023-08-06 ENCOUNTER — Encounter: Payer: Self-pay | Admitting: Cardiovascular Disease

## 2023-08-11 MED ORDER — METOPROLOL TARTRATE 25 MG PO TABS: 25.0000 mg | ORAL_TABLET | Freq: Two times a day (BID) | ORAL | 3 refills | Status: DC

## 2023-09-01 ENCOUNTER — Encounter: Payer: Self-pay | Admitting: Nurse Practitioner

## 2023-09-01 ENCOUNTER — Ambulatory Visit (INDEPENDENT_AMBULATORY_CARE_PROVIDER_SITE_OTHER): Payer: BC Managed Care – PPO | Admitting: Nurse Practitioner

## 2023-09-01 VITALS — BP 118/76 | HR 65 | Temp 97.4°F | Resp 20 | Ht 74.0 in | Wt 284.0 lb

## 2023-09-01 DIAGNOSIS — T7840XA Allergy, unspecified, initial encounter: Secondary | ICD-10-CM | POA: Diagnosis not present

## 2023-09-01 NOTE — Progress Notes (Signed)
Subjective:    Patient ID: Cameron Cameron [name], male    DOB: 1966-01-17, 57 y.o.   MRN: [id number]   Chief Complaint: Possible allergic reaction   HPI  Patient Cameron [name] been having a hard time. He had covid and Cameron [name] been sick since then. He actually developed a abscessed tooth and needs to have dental work done. They gave him amoxicillin which was not helping so they switched him to cephalexin. Since starting cephelexin he Cameron [name] had cramps and developed a rash on thighs. Have lower ext edema. He stopped the cephalexin yesterday. He is some better this morning.  Patient Active Problem List   Diagnosis Date Noted   BMI 35.0-35.9,adult 06/02/2018   Hemangioma of skin 03/16/2018   Actinic keratosis 03/16/2018   CAD (coronary artery disease)    HLD (hyperlipidemia)    HTN (hypertension)    NSTEMI (non-ST elevated myocardial infarction) (HCC) 05/08/2016   Tobacco abuse        Review of Systems  Constitutional:  Negative for diaphoresis.  Eyes:  Negative for pain.  Respiratory:  Negative for shortness of breath.   Cardiovascular:  Negative for chest pain, palpitations and leg swelling.  Gastrointestinal:  Negative for abdominal pain.  Endocrine: Negative for polydipsia.  Skin:  Negative for rash.  Neurological:  Negative for dizziness, weakness and headaches.  Hematological:  Does not bruise/bleed easily.  All other systems reviewed and are negative.      Objective:   Physical Exam Vitals and nursing note reviewed.  Constitutional:      Appearance: Normal appearance. He is well-developed.  HENT:     Head: Normocephalic.     Nose: Nose normal.     Mouth/Throat:     Mouth: Mucous membranes are moist.     Pharynx: Oropharynx is clear.  Eyes:     Pupils: Pupils are equal, round, and reactive to light.  Neck:     Thyroid: No thyroid mass or thyromegaly.     Vascular: No carotid bruit or JVD.     Trachea: Phonation normal.  Cardiovascular:     Rate and Rhythm: Normal rate and regular  rhythm.  Pulmonary:     Effort: Pulmonary effort is normal. No respiratory distress.     Breath sounds: Normal breath sounds.  Abdominal:     General: Bowel sounds are normal.     Palpations: Abdomen is soft.     Tenderness: There is no abdominal tenderness.  Musculoskeletal:        General: Normal range of motion.     Cervical back: Normal range of motion and neck supple.  Lymphadenopathy:     Cervical: No cervical adenopathy.  Skin:    General: Skin is warm and dry.  Neurological:     Mental Status: He is alert and oriented to person, place, and time.  Psychiatric:        Behavior: Behavior normal.        Thought Content: Thought content normal.        Judgment: Judgment normal.     BP 118/76   Pulse 65   Temp (!) 97.4 F (36.3 C) (Temporal)   Resp 20   Ht 6\' 2"  (1.88 m)   Wt 284 lb (128.8 kg)   SpO2 96%   BMI 36.46 kg/m        Assessment & Plan:   Cameron Cameron [name] in today with chief complaint of Possible allergic reaction   1. Allergic reaction, initial encounter Hold keflex Keep  dentist appointmnet. Let dentist know that he could not handle cephalexin RTO prn    The above assessment and management plan was discussed with the patient. The patient verbalized understanding of and Cameron [name] agreed to the management plan. Patient is aware to call the clinic if symptoms persist or worsen. Patient is aware when to return to the clinic for a follow-up visit. Patient educated on when it is appropriate to go to the emergency department.   Mary-Margaret Daphine Deutscher, FNP

## 2023-09-01 NOTE — Telephone Encounter (Signed)
Patient seen in office on 09/01/23

## 2023-09-01 NOTE — Addendum Note (Signed)
Addended by: Bennie Pierini on: 09/01/2023 08:38 AM   Modules accepted: Orders

## 2023-09-02 LAB — BMP8+EGFR
BUN/Creatinine Ratio: 12 (ref 9–20)
BUN: 14 mg/dL (ref 6–24)
CO2: 22 mmol/L (ref 20–29)
Calcium: 9.4 mg/dL (ref 8.7–10.2)
Chloride: 103 mmol/L (ref 96–106)
Creatinine, Ser: 1.19 mg/dL (ref 0.76–1.27)
Glucose: 117 mg/dL — ABNORMAL HIGH (ref 70–99)
Potassium: 4.7 mmol/L (ref 3.5–5.2)
Sodium: 141 mmol/L (ref 134–144)
eGFR: 70 mL/min/{1.73_m2} (ref >59)

## 2023-09-08 ENCOUNTER — Encounter: Payer: Self-pay | Admitting: Nurse Practitioner

## 2023-09-08 ENCOUNTER — Ambulatory Visit (INDEPENDENT_AMBULATORY_CARE_PROVIDER_SITE_OTHER): Payer: BC Managed Care – PPO | Admitting: Nurse Practitioner

## 2023-09-08 VITALS — BP 131/78 | HR 67 | Temp 97.2°F | Ht 74.0 in | Wt 283.0 lb

## 2023-09-08 DIAGNOSIS — Z23 Encounter for immunization: Secondary | ICD-10-CM

## 2023-09-08 DIAGNOSIS — Z0001 Encounter for general adult medical examination with abnormal findings: Secondary | ICD-10-CM | POA: Diagnosis not present

## 2023-09-08 DIAGNOSIS — Z0000 Encounter for general adult medical examination without abnormal findings: Secondary | ICD-10-CM

## 2023-09-08 DIAGNOSIS — E7800 Pure hypercholesterolemia, unspecified: Secondary | ICD-10-CM | POA: Diagnosis not present

## 2023-09-08 DIAGNOSIS — Z720 Tobacco use: Secondary | ICD-10-CM

## 2023-09-08 DIAGNOSIS — I2510 Atherosclerotic heart disease of native coronary artery without angina pectoris: Secondary | ICD-10-CM | POA: Diagnosis not present

## 2023-09-08 DIAGNOSIS — I214 Non-ST elevation (NSTEMI) myocardial infarction: Secondary | ICD-10-CM

## 2023-09-08 DIAGNOSIS — I10 Essential (primary) hypertension: Secondary | ICD-10-CM

## 2023-09-08 DIAGNOSIS — Z6835 Body mass index (BMI) 35.0-35.9, adult: Secondary | ICD-10-CM

## 2023-09-08 LAB — LIPID PANEL

## 2023-09-08 MED ORDER — ATORVASTATIN CALCIUM 80 MG PO TABS: ORAL_TABLET | ORAL | 1 refills | Status: DC

## 2023-09-08 MED ORDER — METOPROLOL TARTRATE 25 MG PO TABS: 25.0000 mg | ORAL_TABLET | Freq: Two times a day (BID) | ORAL | 1 refills | Status: AC

## 2023-09-08 NOTE — Patient Instructions (Signed)
Exercising to Stay Healthy To become healthy and stay healthy, it is recommended that you do moderate-intensity and vigorous-intensity exercise. You can tell that you are exercising at a moderate intensity if your heart starts beating faster and you start breathing faster but can still hold a conversation. You can tell that you are exercising at a vigorous intensity if you are breathing much harder and faster and cannot hold a conversation while exercising. How can exercise benefit me? Exercising regularly is important. It has many health benefits, such as: Improving overall fitness, flexibility, and endurance. Increasing bone density. Helping with weight control. Decreasing body fat. Increasing muscle strength and endurance. Reducing stress and tension, anxiety, depression, or anger. Improving overall health. What guidelines should I follow while exercising? Before you start a new exercise program, talk with your health care provider. Do not exercise so much that you hurt yourself, feel dizzy, or get very short of breath. Wear comfortable clothes and wear shoes with good support. Drink plenty of water while you exercise to prevent dehydration or heat stroke. Work out until your breathing and your heartbeat get faster (moderate intensity). How often should I exercise? Choose an activity that you enjoy, and set realistic goals. Your health care provider can help you make an activity plan that is individually designed and works best for you. Exercise regularly as told by your health care provider. This may include: Doing strength training two times a week, such as: Lifting weights. Using resistance bands. Push-ups. Sit-ups. Yoga. Doing a certain intensity of exercise for a given amount of time. Choose from these options: A total of 150 minutes of moderate-intensity exercise every week. A total of 75 minutes of vigorous-intensity exercise every week. A mix of moderate-intensity and  vigorous-intensity exercise every week. Children, pregnant women, people who have not exercised regularly, people who are overweight, and older adults may need to talk with a health care provider about what activities are safe to perform. If you have a medical condition, be sure to talk with your health care provider before you start a new exercise program. What are some exercise ideas? Moderate-intensity exercise ideas include: Walking 1 mile (1.6 km) in about 15 minutes. Biking. Hiking. Golfing. Dancing. Water aerobics. Vigorous-intensity exercise ideas include: Walking 4.5 miles (7.2 km) or more in about 1 hour. Jogging or running 5 miles (8 km) in about 1 hour. Biking 10 miles (16.1 km) or more in about 1 hour. Lap swimming. Roller-skating or in-line skating. Cross-country skiing. Vigorous competitive sports, such as football, basketball, and soccer. Jumping rope. Aerobic dancing. What are some everyday activities that can help me get exercise? Yard work, such as: Pushing a lawn mower. Raking and bagging leaves. Washing your car. Pushing a stroller. Shoveling snow. Gardening. Washing windows or floors. How can I be more active in my day-to-day activities? Use stairs instead of an elevator. Take a walk during your lunch break. If you drive, park your car farther away from your work or school. If you take public transportation, get off one stop early and walk the rest of the way. Stand up or walk around during all of your indoor phone calls. Get up, stretch, and walk around every 30 minutes throughout the day. Enjoy exercise with a friend. Support to continue exercising will help you keep a regular routine of activity. Where to find more information You can find more information about exercising to stay healthy from: U.S. Department of Health and Human Services: www.hhs.gov Centers for Disease Control and Prevention (  CDC): www.cdc.gov Summary Exercising regularly is  important. It will improve your overall fitness, flexibility, and endurance. Regular exercise will also improve your overall health. It can help you control your weight, reduce stress, and improve your bone density. Do not exercise so much that you hurt yourself, feel dizzy, or get very short of breath. Before you start a new exercise program, talk with your health care provider. This information is not intended to replace advice given to you by your health care provider. Make sure you discuss any questions you have with your health care provider. Document Revised: 01/05/2021 Document Reviewed: 01/05/2021 Elsevier Patient Education  2024 Elsevier Inc.  

## 2023-09-08 NOTE — Progress Notes (Signed)
Subjective:    Patient ID: Cameron [name], male    DOB: [birth year], 57 y.o.   MRN: [id number]   Chief Complaint: medical management of chronic issues     HPI:  Cameron [name] is a 57 y.o. who identifies as a male who was assigned male at birth.   Social history: Lives with: by hisself Work history: Emergency planning/management officer   Comes in today for follow up of the following chronic medical issues:  1. Primary hypertension No c/o chest pain, sob or headache. Does not check blood pressure at home. BP Readings from Last 3 Encounters:  09/01/23 118/76  03/24/23 114/76  10/09/22 120/78     2. Coronary artery disease involving native coronary artery of native heart without angina pectoris Last saw cardiology in January of 2024. No changes were made to plan of cre.   3. Pure hypercholesterolemia Does watch diet and works out daily Lab Results  Component Value Date   CHOL 125 03/24/2023   HDL 39 (L) 03/24/2023   LDLCALC 64 03/24/2023   TRIG 123 03/24/2023   CHOLHDL 3.2 03/24/2023     4. NSTEMI (non-ST elevated myocardial infarction) [facility]) Was several years and has been doing well.  5. Tobacco abuse Smokeless tobacco  6. BMI 35.0-35.9,adult No recent weight changes. Wt Readings from Last 3 Encounters:  09/08/23 283 lb (128.4 kg)  09/01/23 284 lb (128.8 kg)  03/24/23 278 lb (126.1 kg)   BMI Readings from Last 3 Encounters:  09/08/23 36.34 kg/m  09/01/23 36.46 kg/m  03/24/23 35.69 kg/m      New complaints: None today  No Known Allergies Outpatient Encounter Medications as of 09/08/2023  Medication Sig   aspirin 81 MG tablet Take 81 mg by mouth daily.   atorvastatin (LIPITOR) 80 MG tablet TAKE 1 TABLET BY MOUTH DAILY AT 6PM   cyclobenzaprine (FLEXERIL) 10 MG tablet TAKE 1 TABLET BY MOUTH 3 TIMES DAILY AS NEEDED FOR MUSCLE SPAMS   metoprolol tartrate (LOPRESSOR) 25 MG tablet Take 1 tablet (25 mg total) by mouth 2 (two) times daily.   Multiple Vitamin (MULTIVITAMIN)  capsule Take 1 capsule by mouth daily.   nitroGLYCERIN (NITROSTAT) 0.4 MG SL tablet Place 1 tablet (0.4 mg total) under the tongue every 5 (five) minutes x 3 doses as needed for chest pain.   sildenafil (VIAGRA) 50 MG tablet TAKE ONE TABLET AS NEEDED   No facility-administered encounter medications on file as of 09/08/2023.    Past Surgical History:  Procedure Laterality Date   CARDIAC CATHETERIZATION N/A 05/08/2016   Procedure: Left Heart Cath and Coronary Angiography;  Surgeon: Yvonne Kendall, MD;  Location: [facility] INVASIVE CV LAB;  Service: Cardiovascular;  Laterality: N/A;   CARDIAC CATHETERIZATION N/A 05/08/2016   Procedure: Coronary Stent Intervention;  Surgeon: Yvonne Kendall, MD;  Location: [facility] INVASIVE CV LAB;  Service: Cardiovascular;  Laterality: N/A;  Mid LAD Ostial/Prox Ramus   JOINT REPLACEMENT Left 11/12   knee   KNEE SURGERY      Family History  Problem Relation Age of Onset   Thyroid disease Mother    Cancer Mother        moles- removed   Stroke Father        died in mid 73's during lower ext bypass   Heart attack Father       Controlled substance contract: n/a     Review of Systems  Constitutional:  Negative for diaphoresis.  Eyes:  Negative for pain.  Respiratory:  Negative  for shortness of breath.   Cardiovascular:  Negative for chest pain, palpitations and leg swelling.  Gastrointestinal:  Negative for abdominal pain.  Endocrine: Negative for polydipsia.  Skin:  Negative for rash.  Neurological:  Negative for dizziness, weakness and headaches.  Hematological:  Does not bruise/bleed easily.  All other systems reviewed and are negative.      Objective:   Physical Exam Vitals and nursing note reviewed.  Constitutional:      Appearance: Normal appearance. He is well-developed.  HENT:     Head: Normocephalic.     Nose: Nose normal.     Mouth/Throat:     Mouth: Mucous membranes are moist.     Pharynx: Oropharynx is clear.  Eyes:     Pupils:  Pupils are equal, round, and reactive to light.  Neck:     Thyroid: No thyroid mass or thyromegaly.     Vascular: No carotid bruit or JVD.     Trachea: Phonation normal.  Cardiovascular:     Rate and Rhythm: Normal rate and regular rhythm.  Pulmonary:     Effort: Pulmonary effort is normal. No respiratory distress.     Breath sounds: Normal breath sounds.  Abdominal:     General: Bowel sounds are normal.     Palpations: Abdomen is soft.     Tenderness: There is no abdominal tenderness.  Musculoskeletal:        General: Normal range of motion.     Cervical back: Normal range of motion and neck supple.  Lymphadenopathy:     Cervical: No cervical adenopathy.  Skin:    General: Skin is warm and dry.  Neurological:     Mental Status: He is alert and oriented to person, place, and time.  Psychiatric:        Behavior: Behavior normal.        Thought Content: Thought content normal.        Judgment: Judgment normal.     BP 131/78   Pulse 67   Temp (!) 97.2 F (36.2 C) (Temporal)   Ht 6\' 2"  (1.88 m)   Wt 283 lb (128.4 kg)   SpO2 96%   BMI 36.34 kg/m        Assessment & Plan:   Cameron [name] comes in today with chief complaint of Annual Exam   Diagnosis and orders addressed:  1. Annual physical exam (Primary)  2. Primary hypertension Low sodium diet - CBC with Differential/Platelet - CMP14+EGFR  3. Coronary artery disease involving native coronary artery of native heart without angina pectoris Keep yearly follow up with cardiology  4. Pure hypercholesterolemia Low fat diet - Lipid panel - atorvastatin (LIPITOR) 80 MG tablet; TAKE 1 TABLET BY MOUTH DAILY AT 6PM  Dispense: 90 tablet; Refill: 1  5. NSTEMI (non-ST elevated myocardial infarction) (HCC) - metoprolol tartrate (LOPRESSOR) 25 MG tablet; Take 1 tablet (25 mg total) by mouth 2 (two) times daily.  Dispense: 180 tablet; Refill: 1  6. Tobacco abuse Stop using dip  7. BMI 35.0-35.9,adult Discussed diet  and exercise for person with BMI >25 Will recheck weight in 3-6 months    Labs pending Health Maintenance reviewed Diet and exercise encouraged  Follow up plan: 6 months   Mary-Margaret Daphine Deutscher, FNP

## 2023-09-09 LAB — CMP14+EGFR
ALT: 42 [IU]/L (ref 0–44)
AST: 33 [IU]/L (ref 0–40)
Albumin: 4 g/dL (ref 3.8–4.9)
Alkaline Phosphatase: 85 [IU]/L (ref 44–121)
BUN/Creatinine Ratio: 14 (ref 9–20)
BUN: 18 mg/dL (ref 6–24)
Bilirubin Total: 0.7 mg/dL (ref 0.0–1.2)
CO2: 24 mmol/L (ref 20–29)
Calcium: 9.3 mg/dL (ref 8.7–10.2)
Chloride: 103 mmol/L (ref 96–106)
Creatinine, Ser: 1.3 mg/dL — ABNORMAL HIGH (ref 0.76–1.27)
Globulin, Total: 2.6 g/dL (ref 1.5–4.5)
Glucose: 95 mg/dL (ref 70–99)
Potassium: 4.2 mmol/L (ref 3.5–5.2)
Sodium: 141 mmol/L (ref 134–144)
Total Protein: 6.6 g/dL (ref 6.0–8.5)
eGFR: 63 mL/min/{1.73_m2} (ref >59)

## 2023-09-09 LAB — CBC WITH DIFFERENTIAL/PLATELET
Basophils Absolute: 0 10*3/uL (ref 0.0–0.2)
Basos: 1 %
EOS (ABSOLUTE): 0.3 10*3/uL (ref 0.0–0.4)
Eos: 4 %
Hematocrit: 44.7 % (ref 37.5–51.0)
Hemoglobin: 14.8 g/dL (ref 13.0–17.7)
Immature Grans (Abs): 0 10*3/uL (ref 0.0–0.1)
Immature Granulocytes: 0 %
Lymphocytes Absolute: 2.2 10*3/uL (ref 0.7–3.1)
Lymphs: 30 %
MCH: 31.4 pg (ref 26.6–33.0)
MCHC: 33.1 g/dL (ref 31.5–35.7)
MCV: 95 fL (ref 79–97)
Monocytes Absolute: 0.6 10*3/uL (ref 0.1–0.9)
Monocytes: 8 %
Neutrophils Absolute: 4 10*3/uL (ref 1.4–7.0)
Neutrophils: 57 %
Platelets: 242 10*3/uL (ref 150–450)
RBC: 4.71 x10E6/uL (ref 4.14–5.80)
RDW: 12 % (ref 11.6–15.4)
WBC: 7.1 10*3/uL (ref 3.4–10.8)

## 2023-09-09 LAB — LIPID PANEL
Cholesterol, Total: 153 mg/dL (ref 100–199)
HDL: 44 mg/dL (ref >39)
LDL CALC COMMENT:: 3.5 ratio (ref 0.0–5.0)
LDL Chol Calc (NIH): 85 mg/dL (ref 0–99)
Triglycerides: 137 mg/dL (ref 0–149)
VLDL Cholesterol Cal: 24 mg/dL (ref 5–40)

## 2023-09-30 ENCOUNTER — Ambulatory Visit: Payer: BC Managed Care – PPO | Admitting: Nurse Practitioner

## 2023-10-27 MED ORDER — SILDENAFIL CITRATE 100 MG PO TABS: 50.0000 mg | ORAL_TABLET | Freq: Every day | ORAL | 11 refills | Status: AC | PRN

## 2023-10-30 ENCOUNTER — Encounter: Payer: Self-pay | Admitting: Nurse Practitioner

## 2023-11-17 ENCOUNTER — Encounter: Payer: Self-pay | Admitting: Cardiovascular Disease

## 2023-11-24 ENCOUNTER — Ambulatory Visit: Payer: 59 | Admitting: Cardiovascular Disease

## 2023-11-27 ENCOUNTER — Other Ambulatory Visit: Payer: Self-pay | Admitting: Nurse Practitioner

## 2023-11-27 MED ORDER — TADALAFIL 20 MG PO TABS: 20.0000 mg | ORAL_TABLET | Freq: Every day | ORAL | 0 refills | Status: DC | PRN

## 2023-12-05 ENCOUNTER — Ambulatory Visit: Admitting: Cardiovascular Disease

## 2023-12-14 NOTE — Progress Notes (Deleted)
 No chief complaint on file.  History of Present Illness: 58 yo male with history of CAD, HTN, HLD and tobacco use who is here today for cardiac follow up. He was admitted to [facility] August 2017 with a NSTEMI. Cardiac cath with severe stenosis proximal LAD treated with a drug eluting stent and severe stenosis ostial Ramus intermediate branch which was treated with a drug eluting stent. Echo November 2021 with normal LV systolic function, mild mitral regurgitation. Cardiac monitor January 2024 with sinus and PVCs.   He is here today for follow up. The patient denies any chest pain, dyspnea, palpitations, lower extremity edema, orthopnea, PND, dizziness, near syncope or syncope.   Primary Care Physician: Bennie Pierini, FNP  Past Medical History:  Diagnosis Date   Actinic keratosis 03/16/2018   Aseptic meningitis    a. 06/2003.   CAD (coronary artery disease)    a. NSTEMI: [facility] on 05/08/16 which showed severe double vessel CAD with prox-mid LAD 80% thrombotic stenosis s/p PCI/DES, oRI 95% sten s/p PCI/DES.   HLD (hyperlipidemia)    HTN (hypertension)    Osteoarthritis    a. s/p L Total Knee Arthroplasty.   Tobacco abuse    a. occasional cigarette - approx 1 pack/month.    Past Surgical History:  Procedure Laterality Date   CARDIAC CATHETERIZATION N/A 05/08/2016   Procedure: Left Heart Cath and Coronary Angiography;  Surgeon: Yvonne Kendall, MD;  Location: [facility] INVASIVE CV LAB;  Service: Cardiovascular;  Laterality: N/A;   CARDIAC CATHETERIZATION N/A 05/08/2016   Procedure: Coronary Stent Intervention;  Surgeon: Yvonne Kendall, MD;  Location: [facility] INVASIVE CV LAB;  Service: Cardiovascular;  Laterality: N/A;  Mid LAD Ostial/Prox Ramus   JOINT REPLACEMENT Left 11/12   knee   KNEE SURGERY      Current Outpatient Medications  Medication Sig Dispense Refill   aspirin 81 MG tablet Take 81 mg by mouth daily.     atorvastatin (LIPITOR) 80 MG tablet TAKE 1 TABLET BY MOUTH DAILY AT 6PM 90  tablet 1   cyclobenzaprine (FLEXERIL) 10 MG tablet TAKE 1 TABLET BY MOUTH 3 TIMES DAILY AS NEEDED FOR MUSCLE SPAMS 30 tablet 1   metoprolol tartrate (LOPRESSOR) 25 MG tablet Take 1 tablet (25 mg total) by mouth 2 (two) times daily. 180 tablet 1   Multiple Vitamin (MULTIVITAMIN) capsule Take 1 capsule by mouth daily.     nitroGLYCERIN (NITROSTAT) 0.4 MG SL tablet Place 1 tablet (0.4 mg total) under the tongue every 5 (five) minutes x 3 doses as needed for chest pain. 25 tablet 12   sildenafil (VIAGRA) 100 MG tablet Take 0.5-1 tablets (50-100 mg total) by mouth daily as needed for erectile dysfunction. 5 tablet 11   tadalafil (CIALIS) 20 MG tablet Take 1 tablet (20 mg total) by mouth daily as needed for erectile dysfunction. 10 tablet 0   No current facility-administered medications for this visit.    No Known Allergies  Social History   Socioeconomic History   Marital status: Married    Spouse name: Not on file   Number of children: Not on file   Years of education: Not on file   Highest education level: Not on file  Occupational History   Occupation: Emergency planning/management officer    Employer: [employer]  Tobacco Use   Smoking status: Former   Smokeless tobacco: Current    Types: Snuff   Tobacco comments:    smokes about 1 pack of cigarettes/month.  Vaping Use   Vaping status: Never  Used  Substance and Sexual Activity   Alcohol use: Yes    Comment: occasional.   Drug use: No   Sexual activity: Yes  Other Topics Concern   Not on file  Social History Narrative   Lives in [place] with wife and children.  Regularly exercises.   Social Drivers of Corporate investment banker Strain: Not on file  Food Insecurity: Not on file  Transportation Needs: Not on file  Physical Activity: Not on file  Stress: Not on file  Social Connections: Not on file  Intimate Partner Violence: Not on file    Family History  Problem Relation Age of Onset   Thyroid disease Mother    Cancer Mother         moles- removed   Stroke Father        died in mid 29's during lower ext bypass   Heart attack Father     Review of Systems:  As stated in the HPI and otherwise negative.   There were no vitals taken for this visit.  Physical Examination: General: Well developed, well nourished, NAD  HEENT: OP clear, mucus membranes moist  SKIN: warm, dry. No rashes. Neuro: No focal deficits  Musculoskeletal: Muscle strength 5/5 all ext  Psychiatric: Mood and affect normal  Neck: No JVD, no carotid bruits, no thyromegaly, no lymphadenopathy.  Lungs:Clear bilaterally, no wheezes, rhonci, crackles Cardiovascular: Regular rate and rhythm. No murmurs, gallops or rubs. Abdomen:Soft. Bowel sounds present. Non-tender.  Extremities: No lower extremity edema. Pulses are 2 + in the bilateral DP/PT.  EKG:  EKG is *** ordered today. The ekg ordered today demonstrates   Recent Labs: 09/08/2023: ALT 42; BUN 18; Creatinine, Ser 1.30; Hemoglobin 14.8; Platelets 242; Potassium 4.2; Sodium 141   Lipid Panel    Component Value Date/Time   CHOL 153 09/08/2023 1002   TRIG 137 09/08/2023 1002   TRIG 121 11/01/2013 1019   HDL 44 09/08/2023 1002   HDL 41 11/01/2013 1019   CHOLHDL 3.5 09/08/2023 1002   CHOLHDL 4.9 05/09/2016 0039   VLDL 71 (H) 05/09/2016 0039   LDLCALC 85 09/08/2023 1002   LDLCALC 105 (H) 11/01/2013 1019     Wt Readings from Last 3 Encounters:  09/08/23 128.4 kg  09/01/23 128.8 kg  03/24/23 126.1 kg    Assessment and Plan:  1. CAD: He presented with a NSTEMI in August 2017 and had a drug eluting stent placed in the proximal to mid LAD and a drug eluting stent placed in the ostial Ramus intermediate branch. Echo in 2021 with normal LV function. No chest pain suggestive of angina. Continue ASA, statin and beta blocker.     2. HTN: BP is well controlled. Continue metoprolol.   3. Hyperlipidemia: LDL 85 in December 20234. Continue statin.    4. PVCs: No palpitations. Continue metoprolol.    Labs/ tests ordered today include:   No orders of the defined types were placed in this encounter.  Disposition:   F/U with me in 12 months  Signed, Verne Carrow, MD 12/14/2023 4:26 PM    [facility] [address] [place], [place], [place]  [address] Phone: [phone]; Fax: [phone]

## 2023-12-15 ENCOUNTER — Ambulatory Visit: Admitting: Cardiovascular Disease

## 2024-01-27 ENCOUNTER — Ambulatory Visit: Payer: 59 | Admitting: Physician Assistant

## 2024-01-30 ENCOUNTER — Ambulatory Visit: Admitting: Nurse Practitioner

## 2024-02-23 ENCOUNTER — Other Ambulatory Visit: Payer: Self-pay | Admitting: Nurse Practitioner

## 2024-02-23 MED ORDER — CIPROFLOXACIN-DEXAMETHASONE 0.3-0.1 % OT SUSP
4.0000 [drp] | Freq: Two times a day (BID) | OTIC | 0 refills | Status: DC
Start: 2024-02-23 — End: 2024-03-02

## 2024-03-02 ENCOUNTER — Encounter: Payer: Self-pay | Admitting: Nurse Practitioner

## 2024-03-02 ENCOUNTER — Ambulatory Visit (INDEPENDENT_AMBULATORY_CARE_PROVIDER_SITE_OTHER): Admitting: Nurse Practitioner

## 2024-03-02 VITALS — BP 120/79 | HR 96 | Temp 97.6°F | Ht 74.0 in | Wt 284.0 lb

## 2024-03-02 DIAGNOSIS — H6993 Unspecified Eustachian tube disorder, bilateral: Secondary | ICD-10-CM | POA: Diagnosis not present

## 2024-03-02 DIAGNOSIS — J340 Abscess, furuncle and carbuncle of nose: Secondary | ICD-10-CM | POA: Diagnosis not present

## 2024-03-02 MED ORDER — FLUTICASONE PROPIONATE 50 MCG/ACT NA SUSP: 2.0000 | Freq: Every day | NASAL | 6 refills | Status: DC

## 2024-03-02 MED ORDER — MUPIROCIN 2 % EX OINT: 1.0000 | TOPICAL_OINTMENT | Freq: Two times a day (BID) | CUTANEOUS | 0 refills | Status: AC

## 2024-03-02 MED ORDER — AZITHROMYCIN 250 MG PO TABS: ORAL_TABLET | ORAL | 0 refills | Status: DC

## 2024-03-02 NOTE — Patient Instructions (Signed)
 Eustachian Tube Dysfunction  Eustachian tube dysfunction refers to a condition in which a blockage develops in the narrow passage that connects the middle ear to the back of the nose (eustachian tube). The eustachian tube regulates air pressure in the middle ear by letting air move between the ear and nose. It also helps to drain fluid from the middle ear space. Eustachian tube dysfunction can affect one or both ears. When the eustachian tube does not function properly, air pressure, fluid, or both can build up in the middle ear. What are the causes? This condition occurs when the eustachian tube becomes blocked or cannot open normally. Common causes of this condition include: Ear infections. Colds and other infections that affect the nose, mouth, and throat (upper respiratory tract). Allergies. Irritation from cigarette smoke. Irritation from stomach acid coming up into the esophagus (gastroesophageal reflux). The esophagus is the part of the body that moves food from the mouth to the stomach. Sudden changes in air pressure, such as from descending in an airplane or scuba diving. Abnormal growths in the nose or throat, such as: Growths that line the nose (nasal polyps). Abnormal growth of cells (tumors). Enlarged tissue at the back of the throat (adenoids). What increases the risk? You are more likely to develop this condition if: You smoke. You are overweight. You are a child who has: Certain birth defects of the mouth, such as cleft palate. Large tonsils or adenoids. What are the signs or symptoms? Common symptoms of this condition include: A feeling of fullness in the ear. Ear pain. Clicking or popping noises in the ear. Ringing in the ear (tinnitus). Hearing loss. Loss of balance. Dizziness. Symptoms may get worse when the air pressure around you changes, such as when you travel to an area of high elevation, fly on an airplane, or go scuba diving. How is this diagnosed? This  condition may be diagnosed based on: Your symptoms. A physical exam of your ears, nose, and throat. Tests, such as those that measure: The movement of your eardrum. Your hearing (audiometry). How is this treated? Treatment depends on the cause and severity of your condition. In mild cases, you may relieve your symptoms by moving air into your ears. This is called "popping the ears." In more severe cases, or if you have symptoms of fluid in your ears, treatment may include: Medicines to relieve congestion (decongestants). Medicines that treat allergies (antihistamines). Nasal sprays or ear drops that contain medicines that reduce swelling (steroids). A procedure to drain the fluid in your eardrum. In this procedure, a small tube may be placed in the eardrum to: Drain the fluid. Restore the air in the middle ear space. A procedure to insert a balloon device through the nose to inflate the opening of the eustachian tube (balloon dilation). Follow these instructions at home: Lifestyle Do not do any of the following until your health care provider approves: Travel to high altitudes. Fly in airplanes. Work in a Estate agent or room. Scuba dive. Do not use any products that contain nicotine or tobacco. These products include cigarettes, chewing tobacco, and vaping devices, such as e-cigarettes. If you need help quitting, ask your health care provider. Keep your ears dry. Wear fitted earplugs during showering and bathing. Dry your ears completely after. General instructions Take over-the-counter and prescription medicines only as told by your health care provider. Use techniques to help pop your ears as recommended by your health care provider. These may include: Chewing gum. Yawning. Frequent, forceful swallowing.  Closing your mouth, holding your nose closed, and gently blowing as if you are trying to blow air out of your nose. Keep all follow-up visits. This is important. Contact a  health care provider if: Your symptoms do not go away after treatment. Your symptoms come back after treatment. You are unable to pop your ears. You have: A fever. Pain in your ear. Pain in your head or neck. Fluid draining from your ear. Your hearing suddenly changes. You become very dizzy. You lose your balance. Get help right away if: You have a sudden, severe increase in any of your symptoms. Summary Eustachian tube dysfunction refers to a condition in which a blockage develops in the eustachian tube. It can be caused by ear infections, allergies, inhaled irritants, or abnormal growths in the nose or throat. Symptoms may include ear pain or fullness, hearing loss, or ringing in the ears. Mild cases are treated with techniques to unblock the ears, such as yawning or chewing gum. More severe cases are treated with medicines or procedures. This information is not intended to replace advice given to you by your health care provider. Make sure you discuss any questions you have with your health care provider. Document Revised: 11/20/2020 Document Reviewed: 11/20/2020 Elsevier Patient Education  2024 [employer].

## 2024-03-02 NOTE — Progress Notes (Signed)
   Subjective:    Patient ID: Cameron [name], male    DOB: 06/16/1966, 58 y.o.   MRN: [id number]   Chief Complaint: Nasal Congestion and Ear Pain (Right ear/)   HPI  Patient having issues with his ears. Has pressure in them. Sore to touch. He also has a sore inside his nose that is very tender to touch. Patient Active Problem List   Diagnosis Date Noted   BMI 35.0-35.9,adult 06/02/2018   Hemangioma of skin 03/16/2018   Actinic keratosis 03/16/2018   CAD (coronary artery disease)    HLD (hyperlipidemia)    HTN (hypertension)    NSTEMI (non-ST elevated myocardial infarction) (HCC) 05/08/2016   Tobacco abuse        Review of Systems  Constitutional:  Negative for chills and fever.  HENT:  Positive for congestion, ear pain and sinus pressure. Negative for ear discharge and sore throat.   Respiratory:  Negative for cough.        Objective:   Physical Exam Constitutional:      Appearance: Normal appearance.  HENT:     Right Ear: A middle ear effusion is present.     Left Ear: A middle ear effusion is present.     Nose: Congestion present.     Comments: Erythematous sore in right nares Cardiovascular:     Rate and Rhythm: Normal rate and regular rhythm.  Pulmonary:     Effort: Pulmonary effort is normal.     Breath sounds: Normal breath sounds.  Skin:    General: Skin is warm.  Neurological:     General: No focal deficit present.     Mental Status: He is alert and oriented to person, place, and time.  Psychiatric:        Mood and Affect: Mood normal.    BP 120/79   Pulse 96   Temp 97.6 F (36.4 C) (Temporal)   Ht 6\' 2"  (1.88 m)   Wt 284 lb (128.8 kg)   SpO2 97%   BMI 36.46 kg/m       Assessment & Plan:   Delwyn Filippo in today with chief complaint of Nasal Congestion and Ear Pain (Right ear/)   1. Dysfunction of both eustachian tubes (Primary) Use flonase daily - fluticasone (FLONASE) 50 MCG/ACT nasal spray; Place 2 sprays into both nostrils daily.   Dispense: 16 g; Refill: 6  2. Cellulitis of nasal tip Use ointment daily - mupirocin ointment (BACTROBAN) 2 %; Apply 1 Application topically 2 (two) times daily.  Dispense: 22 g; Refill: 0 - azithromycin  (ZITHROMAX  Z-PAK) 250 MG tablet; As directed  Dispense: 6 tablet; Refill: 0    The above assessment and management plan was discussed with the patient. The patient verbalized understanding of and has agreed to the management plan. Patient is aware to call the clinic if symptoms persist or worsen. Patient is aware when to return to the clinic for a follow-up visit. Patient educated on when it is appropriate to go to the emergency department.   Mary-Margaret Gaylyn Keas, FNP

## 2024-03-09 ENCOUNTER — Ambulatory Visit: Payer: BC Managed Care – PPO | Admitting: Nurse Practitioner

## 2024-03-09 NOTE — Progress Notes (Deleted)
 Subjective:    Patient ID: Cameron [name], male    DOB: 1966/06/01, 58 y.o.   MRN: [id number]   Chief Complaint: medical management of chronic issues     HPI:  Cameron [name] is a 58 y.o. who identifies as a male who was assigned male at birth.   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 Primary hypertension No c/o chest pain, sob or headache. Does not check blood pressure at  home. BP Readings from Last 3 Encounters:  03/02/24 120/79  09/08/23 131/78  09/01/23 118/76     2. Pure hypercholesterolemia Does try to watch diet and exercises several times a week. Lab Results  Component Value Date   CHOL 153 09/08/2023   HDL 44 09/08/2023   LDLCALC 85 09/08/2023   TRIG 137 09/08/2023   CHOLHDL 3.5 09/08/2023     3. Coronary artery disease involving native coronary artery of native heart without angina pectoris 4. NSTEMI (non-ST elevated myocardial infarction) [facility]) Last saw cardiology on 10/09/22. He was having palpitations at the time that were dx as PVC's. Only change to plan of cre was increase in metoprol to 50mg  BID. They had him wear a ZIO monitor for 3 days. Patient told themthat he felt bad on 50mg  of metoprolol  and he went back to 25mg  BID and his PVC's increased. It was decided to leave him on 25mg  BID and he is to let them know how he is feeling.  5. Tobacco abuse Dips daiy  6. BMI 35.0-35.9,adult Weight is down 8lbs  Wt Readings from Last 3 Encounters:  03/02/24 284 lb (128.8 kg)  09/08/23 283 lb (128.4 kg)  09/01/23 284 lb (128.8 kg)   BMI Readings from Last 3 Encounters:  03/02/24 36.46 kg/m  09/08/23 36.34 kg/m  09/01/23 36.46 kg/m     New complaints: None today  No Known Allergies Outpatient Encounter Medications as of 03/09/2024  Medication Sig   aspirin  81 MG  tablet Take 81 mg by mouth daily.   atorvastatin  (LIPITOR ) 80 MG tablet TAKE 1 TABLET BY MOUTH DAILY AT 6PM   azithromycin  (ZITHROMAX  Z-PAK) 250 MG tablet As directed   cyclobenzaprine  (FLEXERIL ) 10 MG tablet TAKE 1 TABLET BY MOUTH 3 TIMES DAILY AS NEEDED FOR MUSCLE SPAMS   fluticasone  (FLONASE ) 50 MCG/ACT nasal spray Place 2 sprays into both nostrils daily.   metoprolol  tartrate (LOPRESSOR ) 25 MG tablet Take 1 tablet (25 mg total) by mouth 2 (two) times daily.   Multiple Vitamin (MULTIVITAMIN) capsule Take 1 capsule by mouth daily.   mupirocin  ointment (BACTROBAN ) 2 % Apply 1 Application topically 2 (two) times daily.   nitroGLYCERIN  (NITROSTAT ) 0.4 MG SL tablet Place 1 tablet (0.4 mg total) under the tongue every 5 (five) minutes x 3 doses as needed for chest pain.   sildenafil  (VIAGRA ) 100 MG tablet Take 0.5-1 tablets (50-100 mg total) by mouth daily as needed for erectile dysfunction.   tadalafil  (CIALIS ) 20 MG tablet Take 1 tablet (20 mg total) by mouth daily as needed for erectile dysfunction.   No facility-administered encounter medications on file as of 03/09/2024.    Past Surgical History:  Procedure Laterality Date   CARDIAC CATHETERIZATION N/A 05/08/2016   Procedure: Left Heart Cath and Coronary Angiography;  Surgeon: Sammy Crisp, MD;  Location: [facility] INVASIVE CV LAB;  Service: Cardiovascular;  Laterality: N/A;   CARDIAC CATHETERIZATION N/A 05/08/2016   Procedure: Coronary Stent Intervention;  Surgeon: Sammy Crisp, MD;  Location: [facility] INVASIVE CV LAB;  Service: Cardiovascular;  Laterality: N/A;  Mid LAD Ostial/Prox Ramus   JOINT REPLACEMENT Left 11/12   knee   KNEE SURGERY      Family History  Problem Relation Age of Onset   Thyroid disease Mother    Cancer Mother        moles- removed   Stroke Father        died in mid 6's during lower ext bypass   Heart attack Father       Controlled substance contract: n/a     Review of Systems  Constitutional:  Negative  for diaphoresis.  Eyes:  Negative for pain.  Respiratory:  Negative for shortness of breath.   Cardiovascular:  Negative for chest pain, palpitations and leg swelling.  Gastrointestinal:  Negative for abdominal pain.  Endocrine: Negative for polydipsia.  Skin:  Negative for rash.  Neurological:  Negative for dizziness, weakness and headaches.  Hematological:  Does not bruise/bleed easily.  All other systems reviewed and are negative.      Objective:   Physical Exam Vitals and nursing note reviewed.  Constitutional:      Appearance: Normal appearance. He is well-developed.  HENT:     Head: Normocephalic.     Nose: Nose normal.     Mouth/Throat:     Mouth: Mucous membranes are moist.     Pharynx: Oropharynx is clear.   Eyes:     Pupils: Pupils are equal, round, and reactive to light.   Neck:     Thyroid: No thyroid mass or thyromegaly.     Vascular: No carotid bruit or JVD.     Trachea: Phonation normal.   Cardiovascular:     Rate and Rhythm: Normal rate and regular rhythm.  Pulmonary:     Effort: Pulmonary effort is normal. No respiratory distress.     Breath sounds: Normal breath sounds.  Abdominal:     General: Bowel sounds are normal.     Palpations: Abdomen is soft.     Tenderness: There is no abdominal tenderness.   Musculoskeletal:        General: Normal range of motion.     Cervical back: Normal range of motion and neck supple.  Lymphadenopathy:     Cervical: No cervical adenopathy.   Skin:    General: Skin is warm and dry.   Neurological:     Mental Status: He is alert and oriented to person, place, and time.   Psychiatric:        Behavior: Behavior normal.        Thought Content: Thought content normal.        Judgment: Judgment normal.    There were no vitals taken for this visit.        Assessment & Plan:   Cameron [name] comes in today with chief complaint of No chief complaint on file.   Diagnosis and orders addressed:  1. Primary  hypertension Low sodium diet - CBC with Differential/Platelet - CMP14+EGFR  2. Pure hypercholesterolemia Low fat diet - Lipid panel - atorvastatin  (LIPITOR ) 80 MG tablet; TAKE 1 TABLET BY MOUTH DAILY AT 6PM  Dispense: 90 tablet; Refill: 1  3. Coronary artery disease involving native coronary artery of native heart without angina pectoris 4. NSTEMI (non-ST elevated myocardial infarction) [facility]) Keep followup with cardiology  5. Tobacco abuse  Discussed stopping tobacco  use  6. BMI 35.0-35.9,adult Discussed diet and exercise for person with BMI >25 Will recheck weight in 3-6 months   7. Strain of neck muscle, initial encounter Moist heat rest - cyclobenzaprine  (FLEXERIL ) 10 MG tablet; TAKE 1 TABLET BY MOUTH 3 TIMES DAILY AS NEEDED FOR MUSCLE SPAMS  Dispense: 30 tablet; Refill: 1   Labs pending Health Maintenance reviewed Diet and exercise encouraged  Follow up plan: 6 months   Mary-Margaret Gaylyn Keas, FNP

## 2024-03-16 ENCOUNTER — Encounter: Payer: Self-pay | Admitting: Nurse Practitioner

## 2024-04-05 ENCOUNTER — Other Ambulatory Visit: Payer: Self-pay | Admitting: Nurse Practitioner

## 2024-05-25 ENCOUNTER — Ambulatory Visit (INDEPENDENT_AMBULATORY_CARE_PROVIDER_SITE_OTHER): Admitting: Nurse Practitioner

## 2024-05-25 ENCOUNTER — Encounter: Payer: Self-pay | Admitting: Nurse Practitioner

## 2024-05-25 VITALS — BP 111/65 | HR 69 | Temp 98.0°F | Ht 74.0 in | Wt 280.0 lb

## 2024-05-25 DIAGNOSIS — G9339 Other post infection and related fatigue syndromes: Secondary | ICD-10-CM

## 2024-05-25 NOTE — Patient Instructions (Signed)
 Fatigue If you have fatigue, you feel tired all the time and have a lack of energy or a lack of motivation. Fatigue may make it difficult to start or complete tasks because of exhaustion. Occasional or mild fatigue is often a normal response to activity or life. However, long-term (chronic) or extreme fatigue may be a symptom of a medical condition such as: Depression. Not having enough red blood cells or hemoglobin in the blood (anemia). A problem with a small gland located in the lower front part of the neck (thyroid disorder). Rheumatologic conditions. These are problems related to the body's defense system (immune system). Infections, especially certain viral infections. Fatigue can also lead to negative health outcomes over time. Follow these instructions at home: Medicines Take over-the-counter and prescription medicines only as told by your health care provider. Take a multivitamin if told by your health care provider. Do not use herbal or dietary supplements unless they are approved by your health care provider. Eating and drinking  Avoid heavy meals in the evening. Eat a well-balanced diet, which includes lean proteins, whole grains, plenty of fruits and vegetables, and low-fat dairy products. Avoid eating or drinking too many products with caffeine in them. Avoid alcohol. Drink enough fluid to keep your urine pale yellow. Activity  Exercise regularly, as told by your health care provider. Use or practice techniques to help you relax, such as yoga, tai chi, meditation, or massage therapy. Lifestyle Change situations that cause you stress. Try to keep your work and personal schedules in balance. Do not use recreational or illegal drugs. General instructions Monitor your fatigue for any changes. Go to bed and get up at the same time every day. Avoid fatigue by pacing yourself during the day and getting enough sleep at night. Maintain a healthy weight. Contact a health care  provider if: Your fatigue does not get better. You have a fever. You suddenly lose or gain weight. You have headaches. You have trouble falling asleep or sleeping through the night. You feel angry, guilty, anxious, or sad. You have swelling in your legs or another part of your body. Get help right away if: You feel confused, feel like you might faint, or faint. Your vision is blurry or you have a severe headache. You have severe pain in your abdomen, your back, or the area between your waist and hips (pelvis). You have chest pain, shortness of breath, or an irregular or fast heartbeat. You are unable to urinate, or you urinate less than normal. You have abnormal bleeding from the rectum, nose, lungs, nipples, or, if you are male, the vagina. You vomit blood. You have thoughts about hurting yourself or others. These symptoms may be an emergency. Get help right away. Call 911. Do not wait to see if the symptoms will go away. Do not drive yourself to the hospital. Get help right away if you feel like you may hurt yourself or others, or have thoughts about taking your own life. Go to your nearest emergency room or: Call 911. Call the National Suicide Prevention Lifeline at (262)721-8699 or 988. This is open 24 hours a day. Text the Crisis Text Line at 8450584327. Summary If you have fatigue, you feel tired all the time and have a lack of energy or a lack of motivation. Fatigue may make it difficult to start or complete tasks because of exhaustion. Long-term (chronic) or extreme fatigue may be a symptom of a medical condition. Exercise regularly, as told by your health care provider.  Change situations that cause you stress. Try to keep your work and personal schedules in balance. This information is not intended to replace advice given to you by your health care provider. Make sure you discuss any questions you have with your health care provider. Document Revised: 07/02/2021 Document  Reviewed: 07/02/2021 Elsevier Patient Education  2024 [employer].

## 2024-05-25 NOTE — Progress Notes (Signed)
   Subjective:    Patient ID: Cameron [name], male    DOB: March 22, 1966, 58 y.o.   MRN: [id number]   Chief Complaint: fatigue- recent tick bites  HPI Patient in c/o fatigue. He has no other symptoms. Has had some tick bites. Patient Active Problem List   Diagnosis Date Noted   BMI 35.0-35.9,adult 06/02/2018   Hemangioma of skin 03/16/2018   Actinic keratosis 03/16/2018   CAD (coronary artery disease)    HLD (hyperlipidemia)    HTN (hypertension)    NSTEMI (non-ST elevated myocardial infarction) (HCC) 05/08/2016   Tobacco abuse        Review of Systems  Constitutional:  Negative for diaphoresis.  Eyes:  Negative for pain.  Respiratory:  Negative for shortness of breath.   Cardiovascular:  Negative for chest pain, palpitations and leg swelling.  Gastrointestinal:  Negative for abdominal pain.  Endocrine: Negative for polydipsia.  Skin:  Negative for rash.  Neurological:  Negative for dizziness, weakness and headaches.  Hematological:  Does not bruise/bleed easily.  All other systems reviewed and are negative.      Objective:   Physical Exam Vitals and nursing note reviewed.  Constitutional:      Appearance: Normal appearance. He is well-developed.  Neck:     Thyroid: No thyroid mass or thyromegaly.     Vascular: No carotid bruit or JVD.     Trachea: Phonation normal.  Cardiovascular:     Rate and Rhythm: Normal rate and regular rhythm.  Pulmonary:     Effort: Pulmonary effort is normal. No respiratory distress.     Breath sounds: Normal breath sounds.  Abdominal:     General: Bowel sounds are normal.     Palpations: Abdomen is soft.     Tenderness: There is no abdominal tenderness.  Musculoskeletal:        General: Normal range of motion.     Cervical back: Normal range of motion and neck supple.  Lymphadenopathy:     Cervical: No cervical adenopathy.  Skin:    General: Skin is warm and dry.  Neurological:     Mental Status: He is alert and oriented to  person, place, and time.  Psychiatric:        Behavior: Behavior normal.        Thought Content: Thought content normal.        Judgment: Judgment normal.    BP 111/65   Pulse 69   Temp 98 F (36.7 C) (Temporal)   Ht 6' 2 (1.88 m)   Wt 280 lb (127 kg)   SpO2 95%   BMI 35.95 kg/m         Assessment & Plan:   [name] in today with chief complaint of fatigue  1. Other post infection and related fatigue syndromes (Primary) Refuses blood work Medical illustrator fluids RTO prn    The above assessment and management plan was discussed with the patient. The patient verbalized understanding of and has agreed to the management plan. Patient is aware to call the clinic if symptoms persist or worsen. Patient is aware when to return to the clinic for a follow-up visit. Patient educated on when it is appropriate to go to the emergency department.   Mary-Margaret Gladis, FNP

## 2024-07-05 ENCOUNTER — Ambulatory Visit (INDEPENDENT_AMBULATORY_CARE_PROVIDER_SITE_OTHER): Admitting: Nurse Practitioner

## 2024-07-05 ENCOUNTER — Encounter: Payer: Self-pay | Admitting: Nurse Practitioner

## 2024-07-05 VITALS — BP 113/72 | HR 64 | Temp 98.0°F | Ht 74.0 in | Wt 279.0 lb

## 2024-07-05 DIAGNOSIS — H6993 Unspecified Eustachian tube disorder, bilateral: Secondary | ICD-10-CM | POA: Diagnosis not present

## 2024-07-05 DIAGNOSIS — H60501 Unspecified acute noninfective otitis externa, right ear: Secondary | ICD-10-CM | POA: Diagnosis not present

## 2024-07-05 MED ORDER — FLUTICASONE PROPIONATE 50 MCG/ACT NA SUSP: 2.0000 | Freq: Every day | NASAL | 6 refills | Status: AC

## 2024-07-05 NOTE — Patient Instructions (Signed)
 Otitis Externa  Otitis externa is an infection of the outer ear canal. The outer ear canal is the area between the outside of the ear and the eardrum. Otitis externa is sometimes called swimmer's ear. What are the causes? Common causes of this condition include: Swimming in dirty water. Moisture in the ear. An injury to the inside of the ear. An object stuck in the ear. A cut or scrape on the outside of the ear or in the ear canal. What increases the risk? You are more likely to develop this condition if you go swimming often. What are the signs or symptoms? The first symptom of this condition is often itching in the ear. Later symptoms of the condition include: Swelling of the ear. Redness in the ear. Ear pain. The pain may get worse when you pull on your ear. Pus coming from the ear. How is this diagnosed? This condition may be diagnosed by examining the ear and testing fluid from the ear for bacteria and funguses. How is this treated? This condition may be treated with: Antibiotic ear drops. These are often given for 10-14 days. Medicines to reduce itching and swelling. Follow these instructions at home: If you were prescribed antibiotic ear drops, use them as told by your health care provider. Do not stop using the antibiotic even if you start to feel better. Take over-the-counter and prescription medicines only as told by your health care provider. Avoid getting water in your ears as told by your health care provider. This may include avoiding swimming or water sports for a few days. Keep all follow-up visits. This is important. How is this prevented? Keep your ears dry. Use the corner of a towel to dry your ears after you swim or bathe. Avoid scratching or putting things in your ear. Doing these things can damage the ear canal or remove the protective wax that lines it, which makes it easier for bacteria and funguses to grow. Avoid swimming in lakes, polluted water, or swimming  pools that may not have enough chlorine. Contact a health care provider if: You have a fever. Your ear is still red, swollen, painful, or draining pus after 3 days. Your redness, swelling, or pain gets worse. You have a severe headache. Get help right away if: You have redness, swelling, and pain or tenderness in the area behind your ear. Summary Otitis externa is an infection of the outer ear canal. Common causes include swimming in dirty water, moisture in the ear, or a cut or scrape in the ear. Symptoms include pain, redness, and swelling of the ear canal. If you were prescribed antibiotic ear drops, use them as told by your health care provider. Do not stop using the antibiotic even if you start to feel better. This information is not intended to replace advice given to you by your health care provider. Make sure you discuss any questions you have with your health care provider. Document Revised: 11/22/2020 Document Reviewed: 11/22/2020 Elsevier Patient Education  2024 [employer].

## 2024-07-05 NOTE — Progress Notes (Signed)
   Subjective:    Patient ID: Cameron [name], male    DOB: Nov 20, 1965, 58 y.o.   MRN: [id number]   Chief Complaint: Ear Pain (Right ear/)   HPI  Patient in today c/o right ear pain. Started 3 days ago. Has been using ear drops that he had from previous dx. NO better. No drainage.  Patient Active Problem List   Diagnosis Date Noted   BMI 35.0-35.9,adult 06/02/2018   Hemangioma of skin 03/16/2018   Actinic keratosis 03/16/2018   CAD (coronary artery disease)    HLD (hyperlipidemia)    HTN (hypertension)    NSTEMI (non-ST elevated myocardial infarction) (HCC) 05/08/2016   Tobacco abuse        Review of Systems  Constitutional:  Negative for chills and fever.  HENT:  Positive for congestion and ear pain. Negative for ear discharge, sinus pressure and sinus pain.        Objective:   Physical Exam Constitutional:      Appearance: Normal appearance.  HENT:     Right Ear: A middle ear effusion is present. Tympanic membrane is bulging.     Left Ear: Hearing normal.     Nose: Rhinorrhea present.     Mouth/Throat:     Pharynx: No oropharyngeal exudate.  Cardiovascular:     Rate and Rhythm: Normal rate and regular rhythm.     Heart sounds: Normal heart sounds.  Pulmonary:     Breath sounds: Normal breath sounds.  Skin:    General: Skin is warm.  Neurological:     General: No focal deficit present.     Mental Status: He is alert and oriented to person, place, and time.  Psychiatric:        Mood and Affect: Mood normal.        Behavior: Behavior normal.    BP 113/72   Pulse 64   Temp 98 F (36.7 C) (Temporal)   Ht 6' 2 (1.88 m)   Wt 279 lb (126.6 kg)   SpO2 94%   BMI 35.82 kg/m         Assessment & Plan:   Cameron [name] in today with chief complaint of Ear Pain (Right ear/)   1. Acute non-infective otitis externa of right ear, unspecified type (Primary) Force fluids Norel AD samples- 1 po BID Flonase  daily RTO prn  Meds ordered this encounter   Medications   fluticasone  (FLONASE ) 50 MCG/ACT nasal spray    Sig: Place 2 sprays into both nostrils daily.    Dispense:  16 g    Refill:  6    Supervising Provider:   MARYANNE CHEW A [[id number]]       The above assessment and management plan was discussed with the patient. The patient verbalized understanding of and has agreed to the management plan. Patient is aware to call the clinic if symptoms persist or worsen. Patient is aware when to return to the clinic for a follow-up visit. Patient educated on when it is appropriate to go to the emergency department.   Mary-Margaret Gladis, FNP

## 2024-08-12 ENCOUNTER — Ambulatory Visit: Attending: Cardiovascular Disease | Admitting: Cardiovascular Disease

## 2024-08-12 ENCOUNTER — Encounter: Payer: Self-pay | Admitting: Cardiovascular Disease

## 2024-08-12 VITALS — BP 125/77 | HR 56 | Ht 74.0 in | Wt 279.8 lb

## 2024-08-12 DIAGNOSIS — E785 Hyperlipidemia, unspecified: Secondary | ICD-10-CM | POA: Diagnosis not present

## 2024-08-12 DIAGNOSIS — I2510 Atherosclerotic heart disease of native coronary artery without angina pectoris: Secondary | ICD-10-CM | POA: Diagnosis not present

## 2024-08-12 DIAGNOSIS — I10 Essential (primary) hypertension: Secondary | ICD-10-CM

## 2024-08-12 DIAGNOSIS — I493 Ventricular premature depolarization: Secondary | ICD-10-CM

## 2024-08-12 NOTE — Patient Instructions (Signed)
 Medication Instructions:  Your physician recommends that you continue on your current medications as directed. Please refer to the Current Medication list given to you today.  *If you need a refill on your cardiac medications before your next appointment, please call your pharmacy*  Lab Work: Have lab work done today in the lab on the first floor--Lipid and liver profiles If you have labs (blood work) drawn today and your tests are completely normal, you will receive your results only by: MyChart Message (if you have MyChart) OR A paper copy in the mail If you have any lab test that is abnormal or we need to change your treatment, we will call you to review the results.  Testing/Procedures: none  Follow-Up: At [facility], you and your health needs are our priority.  As part of our continuing mission to provide you with exceptional heart care, our providers are all part of one team.  This team includes your primary Cardiologist (physician) and Advanced Practice Providers or APPs (Physician Assistants and Nurse Practitioners) who all work together to provide you with the care you need, when you need it.  Your next appointment:   12 month(s)  Provider:   Lonni Cash, MD    We recommend signing up for the patient portal called MyChart.  Sign up information is provided on this After Visit Summary.  MyChart is used to connect with patients for Virtual Visits (Telemedicine).  Patients are able to view lab/test results, encounter notes, upcoming appointments, etc.  Non-urgent messages can be sent to your provider as well.   To learn more about what you can do with MyChart, go to [email or web address].   Other Instructions

## 2024-08-12 NOTE — Progress Notes (Signed)
 Chief Complaint  Patient presents with   Follow-up    CAD   History of Present Illness: 58 yo male with history of CAD, HTN, HLD, PVCs, aseptic meningitis and tobacco use who is here today for cardiac follow up. He was admitted to [facility] August 2017 with a NSTEMI. Cardiac cath with a severe stenosis in the proximal LAD treated with a drug eluting stent and a severe stenosis in the ostial Ramus intermediate branch treated with a drug eluting stent. Echo November 2021 with normal LV systolic function, mild mitral regurgitation. Cardiac monitor in January 2024 with PVCs (3.6% burden). Toprol  was increased but this made him feel poorly.   He is here today for follow up. The patient denies any chest pain, dyspnea, palpitations, lower extremity edema, orthopnea, PND, dizziness, near syncope or syncope.   Primary Care Physician: Gladis Mustard, FNP  Past Medical History:  Diagnosis Date   Actinic keratosis 03/16/2018   Aseptic meningitis    a. 06/2003.   CAD (coronary artery disease)    a. NSTEMI: [facility] on 05/08/16 which showed severe double vessel CAD with prox-mid LAD 80% thrombotic stenosis s/p PCI/DES, oRI 95% sten s/p PCI/DES.   HLD (hyperlipidemia)    HTN (hypertension)    Osteoarthritis    a. s/p L Total Knee Arthroplasty.   Tobacco abuse    a. occasional cigarette - approx 1 pack/month.    Past Surgical History:  Procedure Laterality Date   CARDIAC CATHETERIZATION N/A 05/08/2016   Procedure: Left Heart Cath and Coronary Angiography;  Surgeon: Lonni Hanson, MD;  Location: [facility] INVASIVE CV LAB;  Service: Cardiovascular;  Laterality: N/A;   CARDIAC CATHETERIZATION N/A 05/08/2016   Procedure: Coronary Stent Intervention;  Surgeon: Lonni Hanson, MD;  Location: [facility] INVASIVE CV LAB;  Service: Cardiovascular;  Laterality: N/A;  Mid LAD Ostial/Prox Ramus   JOINT REPLACEMENT Left 11/12   knee   KNEE SURGERY      Current Outpatient Medications  Medication Sig Dispense Refill    aspirin  81 MG tablet Take 81 mg by mouth daily.     atorvastatin  (LIPITOR ) 80 MG tablet TAKE 1 TABLET BY MOUTH DAILY AT 6PM 90 tablet 1   cyclobenzaprine  (FLEXERIL ) 10 MG tablet TAKE 1 TABLET BY MOUTH 3 TIMES DAILY AS NEEDED FOR MUSCLE SPAMS 30 tablet 1   fluticasone  (FLONASE ) 50 MCG/ACT nasal spray Place 2 sprays into both nostrils daily. 16 g 6   metoprolol  tartrate (LOPRESSOR ) 25 MG tablet Take 1 tablet (25 mg total) by mouth 2 (two) times daily. 180 tablet 1   Multiple Vitamin (MULTIVITAMIN) capsule Take 1 capsule by mouth daily.     mupirocin  ointment (BACTROBAN ) 2 % Apply 1 Application topically 2 (two) times daily. 22 g 0   nitroGLYCERIN  (NITROSTAT ) 0.4 MG SL tablet Place 1 tablet (0.4 mg total) under the tongue every 5 (five) minutes x 3 doses as needed for chest pain. 25 tablet 12   sildenafil  (VIAGRA ) 100 MG tablet Take 0.5-1 tablets (50-100 mg total) by mouth daily as needed for erectile dysfunction. 5 tablet 11   tadalafil  (CIALIS ) 20 MG tablet TAKE 1 TABLET BY MOUTH DAILY AS NEEDED 10 tablet 5   No current facility-administered medications for this visit.    No Known Allergies  Social History   Socioeconomic History   Marital status: Married    Spouse name: Not on file   Number of children: Not on file   Years of education: Not on file   Highest education level:  Not on file  Occupational History   Occupation: Civil Engineer, Contracting: [employer]  Tobacco Use   Smoking status: Former   Smokeless tobacco: Current    Types: Snuff   Tobacco comments:    smokes about 1 pack of cigarettes/month.  Vaping Use   Vaping status: Never Used  Substance and Sexual Activity   Alcohol use: Yes    Comment: occasional.   Drug use: No   Sexual activity: Yes  Other Topics Concern   Not on file  Social History Narrative   Lives in [place] with wife and children.  Regularly exercises.   Social Drivers of Corporate Investment Banker Strain: Not on file  Food Insecurity:  Not on file  Transportation Needs: Not on file  Physical Activity: Not on file  Stress: Not on file  Social Connections: Not on file  Intimate Partner Violence: Not on file    Family History  Problem Relation Age of Onset   Thyroid disease Mother    Cancer Mother        moles- removed   Stroke Father        died in mid 77's during lower ext bypass   Heart attack Father     Review of Systems:  As stated in the HPI and otherwise negative.   BP 125/77   Pulse (!) 56   Ht 6' 2 (1.88 m)   Wt 279 lb 12.8 oz (126.9 kg)   SpO2 98%   BMI 35.92 kg/m   Physical Examination: General: Well developed, well nourished, NAD  HEENT: OP clear, mucus membranes moist  SKIN: warm, dry. No rashes. Neuro: No focal deficits  Musculoskeletal: Muscle strength 5/5 all ext  Psychiatric: Mood and affect normal  Neck: No JVD, no carotid bruits, no thyromegaly, no lymphadenopathy.  Lungs:Clear bilaterally, no wheezes, rhonci, crackles Cardiovascular: Regular rate and rhythm. No murmurs, gallops or rubs. Abdomen:Soft. Bowel sounds present. Non-tender.  Extremities: No lower extremity edema. Pulses are 2 + in the bilateral DP/PT.  EKG:  EKG is  ordered today. The ekg ordered today demonstrates  EKG Interpretation Date/Time:  Thursday August 12 2024 08:54:12 EST Ventricular Rate:  56 PR Interval:  158 QRS Duration:  90 QT Interval:  412 QTC Calculation: 397 R Axis:   29  Text Interpretation: Sinus bradycardia Confirmed by Verlin Bruckner [phone]) on 08/12/2024 9:05:55 AM   Recent Labs: 09/08/2023: ALT 42; BUN 18; Creatinine, Ser 1.30; Hemoglobin 14.8; Platelets 242; Potassium 4.2; Sodium 141   Lipid Panel    Component Value Date/Time   CHOL 153 09/08/2023 1002   TRIG 137 09/08/2023 1002   TRIG 121 11/01/2013 1019   HDL 44 09/08/2023 1002   HDL 41 11/01/2013 1019   CHOLHDL 3.5 09/08/2023 1002   CHOLHDL 4.9 05/09/2016 0039   VLDL 71 (H) 05/09/2016 0039   LDLCALC 85 09/08/2023  1002   LDLCALC 105 (H) 11/01/2013 1019     Wt Readings from Last 3 Encounters:  08/12/24 279 lb 12.8 oz (126.9 kg)  07/05/24 279 lb (126.6 kg)  05/25/24 280 lb (127 kg)    Assessment and Plan:  1. CAD without angina: He presented with a NSTEMI in August 2017 and had a drug eluting stent placed in the proximal to mid LAD and a drug eluting stent placed in the ostial Ramus intermediate branch. He has done well since then. Echo in 2021 with normal LV function. No chest pain.  Continue ASA, statin and beta  blocker.     2. HTN: BP is well controlled. Continue current medications.   3. Hyperlipidemia: LDL 85 in December 2024. Goal LDL under 70.  Continue statin.  Will check lipids and LFTs today. If LDL is not at goal, will add Zetia.    4. PVCs: No palpitations. Continue metoprolol .   Labs/ tests ordered today include:   Orders Placed This Encounter  Procedures   Lipid Profile   Hepatic function panel   EKG 12-Lead   Disposition:   F/U with me in 12 months  Signed, Lonni Cash, MD 08/12/2024 9:32 AM    [facility] [address] [place], [place], [place]  [address] Phone: [phone]; Fax: [phone]

## 2024-08-13 ENCOUNTER — Ambulatory Visit: Payer: Self-pay | Admitting: Cardiovascular Disease

## 2024-08-13 LAB — HEPATIC FUNCTION PANEL
ALT: 35 IU/L (ref 0–44)
AST: 43 IU/L — ABNORMAL HIGH (ref 0–40)
Albumin: 4.5 g/dL (ref 3.8–4.9)
Alkaline Phosphatase: 75 IU/L (ref 47–123)
Bilirubin Total: 0.9 mg/dL (ref 0.0–1.2)
Bilirubin, Direct: 0.27 mg/dL (ref 0.00–0.40)
Total Protein: 6.7 g/dL (ref 6.0–8.5)

## 2024-08-13 LAB — LIPID PANEL
Chol/HDL Ratio: 2.8 ratio (ref 0.0–5.0)
Cholesterol, Total: 113 mg/dL (ref 100–199)
HDL: 40 mg/dL (ref >39)
LDL Chol Calc (NIH): 58 mg/dL (ref 0–99)
Triglycerides: 75 mg/dL (ref 0–149)
VLDL Cholesterol Cal: 15 mg/dL (ref 5–40)

## 2024-09-02 ENCOUNTER — Other Ambulatory Visit: Payer: Self-pay | Admitting: Nurse Practitioner

## 2024-09-02 MED ORDER — CELECOXIB 200 MG PO CAPS: 200.0000 mg | ORAL_CAPSULE | Freq: Every day | ORAL | 2 refills | Status: AC

## 2024-09-09 ENCOUNTER — Encounter: Payer: Self-pay | Admitting: Nurse Practitioner

## 2024-09-14 ENCOUNTER — Other Ambulatory Visit: Payer: Self-pay | Admitting: Nurse Practitioner

## 2024-09-14 DIAGNOSIS — E7800 Pure hypercholesterolemia, unspecified: Secondary | ICD-10-CM

## 2024-11-30 ENCOUNTER — Ambulatory Visit: Admitting: Nurse Practitioner
